# Patient Record
Sex: Female | Born: 1960 | Race: Black or African American | Hispanic: No | Marital: Single
Health system: Southern US, Community
[De-identification: ages and names within clinical notes are randomized; demographics above are authoritative.]

## PROBLEM LIST (undated history)

## (undated) DIAGNOSIS — I2729 Other secondary pulmonary hypertension: Secondary | ICD-10-CM

## (undated) DIAGNOSIS — I482 Chronic atrial fibrillation, unspecified: Secondary | ICD-10-CM

## (undated) DIAGNOSIS — J9621 Acute and chronic respiratory failure with hypoxia: Secondary | ICD-10-CM

## (undated) DIAGNOSIS — J14 Pneumonia due to Hemophilus influenzae: Secondary | ICD-10-CM

## (undated) DIAGNOSIS — I5032 Chronic diastolic (congestive) heart failure: Secondary | ICD-10-CM

---

## 2018-10-21 ENCOUNTER — Inpatient Hospital Stay
Admission: RE | Admit: 2018-10-21 | Discharge: 2018-11-08 | Disposition: A | Discharge status: Rehabilitation Facility | Payer: Medicare HMO | Source: Other Acute Inpatient Hospital | Attending: Internal Medicine | Admitting: Internal Medicine

## 2018-10-21 ENCOUNTER — Encounter: Payer: Self-pay | Admitting: Internal Medicine

## 2018-10-21 ENCOUNTER — Other Ambulatory Visit (HOSPITAL_COMMUNITY): Payer: Self-pay

## 2018-10-21 DIAGNOSIS — I5032 Chronic diastolic (congestive) heart failure: Secondary | ICD-10-CM

## 2018-10-21 DIAGNOSIS — J9621 Acute and chronic respiratory failure with hypoxia: Secondary | ICD-10-CM

## 2018-10-21 DIAGNOSIS — J14 Pneumonia due to Hemophilus influenzae: Secondary | ICD-10-CM

## 2018-10-21 DIAGNOSIS — Z9911 Dependence on respirator [ventilator] status: Secondary | ICD-10-CM

## 2018-10-21 DIAGNOSIS — I2729 Other secondary pulmonary hypertension: Secondary | ICD-10-CM

## 2018-10-21 DIAGNOSIS — J449 Chronic obstructive pulmonary disease, unspecified: Secondary | ICD-10-CM

## 2018-10-21 DIAGNOSIS — Z4659 Encounter for fitting and adjustment of other gastrointestinal appliance and device: Secondary | ICD-10-CM

## 2018-10-21 DIAGNOSIS — I482 Chronic atrial fibrillation, unspecified: Secondary | ICD-10-CM | POA: Diagnosis present

## 2018-10-21 DIAGNOSIS — R52 Pain, unspecified: Secondary | ICD-10-CM

## 2018-10-21 HISTORY — DX: Other secondary pulmonary hypertension: I27.29

## 2018-10-21 HISTORY — DX: Acute and chronic respiratory failure with hypoxia: J96.21

## 2018-10-21 HISTORY — DX: Chronic diastolic (congestive) heart failure: I50.32

## 2018-10-21 HISTORY — DX: Chronic atrial fibrillation, unspecified: I48.20

## 2018-10-21 HISTORY — DX: Pneumonia due to hemophilus influenzae: J14

## 2018-10-21 MED ORDER — CYCLOSPORINE 0.05 % OP EMUL
1.00 | OPHTHALMIC | Status: DC
Start: 2018-10-21 — End: 2018-10-21

## 2018-10-21 MED ORDER — SODIUM CHLORIDE 0.9 % IV SOLN
10.00 | INTRAVENOUS | Status: DC
Start: ? — End: 2018-10-21

## 2018-10-21 MED ORDER — STRI-DEX MAXIMUM STRENGTH 2 % EX PADS
50.00 | MEDICATED_PAD | CUTANEOUS | Status: DC
Start: ? — End: 2018-10-21

## 2018-10-21 MED ORDER — AMLODIPINE BESYLATE 10 MG PO TABS
10.00 | ORAL_TABLET | ORAL | Status: DC
Start: 2018-10-22 — End: 2018-10-21

## 2018-10-21 MED ORDER — POLYETHYLENE GLYCOL 3350 17 G PO PACK
17.00 | PACK | ORAL | Status: DC
Start: ? — End: 2018-10-21

## 2018-10-21 MED ORDER — INSULIN GLARGINE 100 UNIT/ML ~~LOC~~ SOLN
1.00 | SUBCUTANEOUS | Status: DC
Start: ? — End: 2018-10-21

## 2018-10-21 MED ORDER — HYDRALAZINE HCL 20 MG/ML IJ SOLN
20.00 | INTRAMUSCULAR | Status: DC
Start: ? — End: 2018-10-21

## 2018-10-21 MED ORDER — LORAZEPAM 0.5 MG PO TABS
0.50 | ORAL_TABLET | ORAL | Status: DC
Start: ? — End: 2018-10-21

## 2018-10-21 MED ORDER — METOPROLOL TARTRATE 50 MG PO TABS
50.00 | ORAL_TABLET | ORAL | Status: DC
Start: 2018-10-21 — End: 2018-10-21

## 2018-10-21 MED ORDER — INSULIN GLARGINE 100 UNIT/ML ~~LOC~~ SOLN
1.00 | SUBCUTANEOUS | Status: DC
Start: 2018-10-21 — End: 2018-10-21

## 2018-10-21 MED ORDER — GENERIC EXTERNAL MEDICATION
Status: DC
Start: ? — End: 2018-10-21

## 2018-10-21 MED ORDER — INSULIN LISPRO 100 UNIT/ML ~~LOC~~ SOLN
1.00 | SUBCUTANEOUS | Status: DC
Start: 2018-10-21 — End: 2018-10-21

## 2018-10-21 MED ORDER — GABAPENTIN 250 MG/5ML PO SOLN
600.00 | ORAL | Status: DC
Start: 2018-10-21 — End: 2018-10-21

## 2018-10-21 MED ORDER — APAP PO
40.00 | ORAL | Status: DC
Start: 2018-10-21 — End: 2018-10-21

## 2018-10-21 MED ORDER — INSULIN LISPRO 100 UNIT/ML ~~LOC~~ SOLN
1.00 | SUBCUTANEOUS | Status: DC
Start: ? — End: 2018-10-21

## 2018-10-21 MED ORDER — MYLANTA ULTRA 700-300 MG PO CHEW
3.00 | CHEWABLE_TABLET | ORAL | Status: DC
Start: 2018-10-21 — End: 2018-10-21

## 2018-10-21 MED ORDER — ALBUTEROL SULFATE (2.5 MG/3ML) 0.083% IN NEBU
2.50 | INHALATION_SOLUTION | RESPIRATORY_TRACT | Status: DC
Start: ? — End: 2018-10-21

## 2018-10-21 MED ORDER — DIPHENOXYLATE-ATROPINE 2.5-0.025 MG/5ML PO LIQD
5.00 | ORAL | Status: DC
Start: ? — End: 2018-10-21

## 2018-10-21 MED ORDER — FIRST-LANSOPRAZOLE 3 MG/ML PO SUSP
30.00 | ORAL | Status: DC
Start: 2018-10-21 — End: 2018-10-21

## 2018-10-21 MED ORDER — LACTOBACILLUS ACID-PECTIN PO CAPS
0.50 | ORAL_CAPSULE | ORAL | Status: DC
Start: ? — End: 2018-10-21

## 2018-10-21 MED ORDER — APIXABAN 5 MG PO TABS
5.00 | ORAL_TABLET | ORAL | Status: DC
Start: 2018-10-21 — End: 2018-10-21

## 2018-10-21 MED ORDER — METOPROLOL TARTRATE 5 MG/5ML IV SOLN
5.00 | INTRAVENOUS | Status: DC
Start: ? — End: 2018-10-21

## 2018-10-21 MED ORDER — ACETAMINOPHEN 325 MG PO TABS
650.00 | ORAL_TABLET | ORAL | Status: DC
Start: ? — End: 2018-10-21

## 2018-10-21 MED ORDER — LABETALOL HCL 5 MG/ML IV SOLN
20.00 | INTRAVENOUS | Status: DC
Start: ? — End: 2018-10-21

## 2018-10-21 MED ORDER — TROPICAL LIQUID NUTRITION PO LIQD
5.00 | ORAL | Status: DC
Start: 2018-10-22 — End: 2018-10-21

## 2018-10-21 MED ORDER — IBUPROFEN 200 MG PO TABS
400.00 | ORAL_TABLET | ORAL | Status: DC
Start: ? — End: 2018-10-21

## 2018-10-21 MED ORDER — EQL HEAVY DUTY FABRIC STRIPS MISC
25.00 | Status: DC
Start: ? — End: 2018-10-21

## 2018-10-21 NOTE — Consult Note (Signed)
Pulmonary Critical Care Medicine [facility] [place]  PULMONARY SERVICE  Date of Service: 10/21/2018  PULMONARY CRITICAL CARE CONSULT   EDNA REDE  ZOX:[id number]  DOB: 03-25-1961   DOA: 10/21/2018  Referring Physician: Carron Curie, MD  HPI: CALLEE ROHRIG is a 58 y.o. female seen for follow up of Acute on Chronic Respiratory Failure.  Patient presented as a transfer from [facility] was admitted on 18 March with complaints of Haemophilus influenza pneumonia respiratory failure metapneumovirus pneumonia pulmonary hypertension sleep apnea exacerbation of asthma.  Patient apparently presented to the emergency with increased altered mental status and shortness of breath.  The patient had apparently been followed by the primary care office and had worsened in the meantime.  Initially patient was started on BiPAP and did seem to show improvement initially.  Patient was also started on aggressive nebulizers and steroids.  However the situation changed had deterioration in status and ended up pending in the ICU intubated on the ventilator.  Patient was started on antibiotics echocardiogram was also done which did show elevated right-sided pressures.  Subsequently was started on weaning protocol and had been on the ventilator for 11 days and felt that patient be attempted extubation.  Patient did extubate initially did okay however then developed stridor and increased work of breathing and ended up having to be placed back on the ventilator.  Ultimately patient ended up having to have a tracheostomy done.  Transferred to our facility for further management  Review of Systems:  ROS performed and is unremarkable other than noted above.  Past Medical History:  Diagnosis Date  ?Marland Kitchen Anxiety  ?Marland Kitchen Cataract  left eye  ?Marland Kitchen Chest pain  [facility] OF 09/01/2015. WORSE WITH MOVEMENT. NO CHEST TIGHTNESS. PT. FEELS ASSOCIATED WITH STRESS  ?. Coronary artery disease  FOLLOWED BY DR. ST.  CLAIR @ [facility]. HAS ALSO SEEN DR. Emelda Brothers. LAST SEEN 05/2015  ?Marland Kitchen Depression  ?. Diabetes mellitus (*)  AVG: 120-200. RECENTLY HAS BEEN BETTER CONTROLLED  ?Marland Kitchen Fibroid  12-14 WK SIZE  ?Marland Kitchen GERD (gastroesophageal reflux disease)  ?Marland Kitchen Heart murmur  at birth  ?. History of colonic polyps 03/15/2018  9/19 colon - small adenoma Recommend surveillance exam in 9/24 - [facility]  ?Marland Kitchen Iron deficiency anemia  ?. mild LVH (left ventricular hypertrophy) due to hypertensive disease Oct 2012 07/20/2011  ?Marland Kitchen Myocardial infarction (*) 2007  Stent (1) inserted  ?Marland Kitchen Neuropathy  ?. Palpitations 06/10/2015  ?Marland Kitchen S/P cardiac catheterization Oct 2012- patent stents to RCA only 10-20% prox RCA 07/20/2011  ?Marland Kitchen S/P primary angioplasty with coronary stent x 2 to RCA, 2007 and Nov 2011 07/20/2011  ?Marland Kitchen Sleep apnea  On CPAP   Past Surgical History:  Procedure Laterality Date  ?Marland Kitchen Breast biopsy Left 07/24/11  BENIGN  ?. Carotid stent 2007  2 stents in RCA  ?Marland Kitchen Cataract and laser Left 2010  ?Marland Kitchen Cesarean section 2X  ?Marland Kitchen Cholecystectomy  ?Marland Kitchen Colonoscopy 01/16/2014  Dr. Tiburcio Pea- [facility]  ?Marland Kitchen Colonoscopy 03/13/2018  ?Marland Kitchen Combined hysteroscopy diagnostic / d&c 02/20/14  ?Marland Kitchen Laparoscopic cholecystectomy 07/2011  ?Marland Kitchen Shoulder surgery Left  ?. Skin graft 4th finger r hand age 27  ?Marland Kitchen Tubal ligation 01/1985  Concurrent with C/S  ?Marland Kitchen Upper gastrointestinal endoscopy 01/16/2014  Dr. Tiburcio Pea- [facility]  ?Marland Kitchen Vascular ultrasound   Allergies  Allergen Reactions  ?Marland Kitchen Benadryl [Allergy Relief] Anaphylaxis  ?Marland Kitchen Diphenhydramine Hcl Other  ?. Robaxin [Methocarbamol] Other  Felt like having a heart attack  ?Marland Kitchen Sulfa Antibiotics  Rash  ?. Atorvastatin Abdominal Pain  ?Marland Kitchen Lisinopril Cough  ?. Simvastatin Abdominal Pain    Social History   Socioeconomic History  ?. Marital status: Single  Spouse name: Not on file  ?. Number of children: 2  ?. Years of education: 34  ?Marland Kitchen Highest education level: Not on file  Occupational History  ?Marland Kitchen Occupation: Ecologist  ?Marland Kitchen Occupation: Academic librarian: [employer]  Employer: [employer]  Social Needs  ?Marland Kitchen Financial resource strain: Not on file  ?Marland Kitchen Food insecurity  Worry: Not on file  Inability: Not on file  ?Marland Kitchen Transportation needs  Medical: Not on file  Non-medical: Not on file  Tobacco Use  ?Marland Kitchen Smoking status: Former Smoker  Packs/day: 1.00  Types: Cigarettes  Start date: 1992  Last attempt to quit: 2004  Years since quitting: 16.3  ?Marland Kitchen Smokeless tobacco: Never Used  Substance and Sexual Activity  ?Marland Kitchen Alcohol use: Yes  Alcohol/week: 0.0 standard drinks    Family History: Non-Contributory to the present illness  Allergies not on file  Medications: Reviewed on Rounds  Physical Exam:  Vitals: Temperature 98 pulse 90 respiratory rate was 20 saturations 98%  Ventilator Settings currently off the ventilator on T collar  . General: Comfortable at this time . Eyes: Grossly normal lids, irises & conjunctiva . ENT: grossly tongue is normal . Neck: no obvious mass . Cardiovascular: S1-S2 normal no gallop or rub is noted . Respiratory: No rhonchi no rales are noted . Abdomen: Soft and nontender . Skin: no rash seen on limited exam . Musculoskeletal: not rigid . Psychiatric:unable to assess . Neurologic: no seizure no involuntary movements         Labs on Admission:  Basic Metabolic Panel: No results for input(s): NA, K, CL, CO2, GLUCOSE, BUN, CREATININE, CALCIUM, MG, PHOS in the last 168 hours.  No results for input(s): PHART, PCO2ART, PO2ART, HCO3, O2SAT in the last 168 hours.  Liver Function Tests: No results for input(s): AST, ALT, ALKPHOS, BILITOT, PROT, ALBUMIN in the last 168 hours. No results for input(s): LIPASE, AMYLASE in the last 168 hours. No results for input(s): AMMONIA in the last 168 hours.  CBC: No results for input(s): WBC, NEUTROABS, HGB, HCT, MCV, PLT in the last 168 hours.  Cardiac Enzymes: No results for input(s): CKTOTAL, CKMB,  CKMBINDEX, TROPONINI in the last 168 hours.  BNP (last 3 results) No results for input(s): BNP in the last 8760 hours.  ProBNP (last 3 results) No results for input(s): PROBNP in the last 8760 hours.   Radiological Exams on Admission: Dg Abd 1 View  Result Date: 10/21/2018 CLINICAL DATA:  Evaluate NG tube EXAM: ABDOMEN - 1 VIEW COMPARISON:  None. FINDINGS: The distal tip of the NG tube is in the right upper abdomen, near the distal gastric antrum. IMPRESSION: The distal tip of the NG tube is in the right upper quadrant, near the distal gastric antrum. Electronically Signed   By: Gerome Sam III M.D   On: 10/21/2018 13:20   Dg Chest Port 1 View  Result Date: 10/21/2018 CLINICAL DATA:  Patient on ventilator EXAM: PORTABLE CHEST 1 VIEW COMPARISON:  None. FINDINGS: The right PICC line terminates in the central SVC. A tracheostomy tube is projected over the trachea. No pneumothorax. Cardiomegaly identified. Mild atelectasis in the left base. No nodules, masses, suspicious infiltrates, or other abnormalities. IMPRESSION: 1. Cardiomegaly. 2. Support apparatus as above. 3. Mild atelectasis in the left base. Electronically Signed  By: Gerome Samavid  Williams III M.D   On: 10/21/2018 13:19    Assessment/Plan Active Problems:   Acute on chronic respiratory failure with hypoxia (HCC)   Haemophilus influenzae pneumonia (HCC)   Chronic atrial fibrillation   Other secondary pulmonary hypertension (HCC)   Chronic diastolic heart failure (HCC)   1. Acute on chronic respiratory failure with hypoxia patient is off the ventilator right now on T collar tracheostomy is in place.  We will titrate oxygen down and have respiratory therapy assess the tracheostomy for potential of PMV and capping. 2. Pneumonia secondary to Haemophilus influenzae has been treated with antibiotics we will continue with supportive care with follow-up x-ray as above showing some mild atelectasis. 3. Atrial fibrillation rate controlled  at this time we will continue with present management 4. Pulmonary hypertension found on the echocardiogram when the patient was acutely ill we will need to reassess possibly as an outpatient. 5. Chronic diastolic heart failure ejection fraction preserved we will continue with supportive care at this time.  I have personally seen and evaluated the patient, evaluated laboratory and imaging results, formulated the assessment and plan and placed orders. The Patient requires high complexity decision making for assessment and support.  Case was discussed on Rounds with the Respiratory Therapy Staff Time Spent 70minutes  Yevonne PaxSaadat A Khan, MD [facility] Pulmonary Critical Care Medicine Sleep Medicine

## 2018-10-22 DIAGNOSIS — J9621 Acute and chronic respiratory failure with hypoxia: Secondary | ICD-10-CM | POA: Diagnosis not present

## 2018-10-22 DIAGNOSIS — I5032 Chronic diastolic (congestive) heart failure: Secondary | ICD-10-CM | POA: Diagnosis not present

## 2018-10-22 DIAGNOSIS — J14 Pneumonia due to Hemophilus influenzae: Secondary | ICD-10-CM | POA: Diagnosis not present

## 2018-10-22 DIAGNOSIS — I482 Chronic atrial fibrillation, unspecified: Secondary | ICD-10-CM | POA: Diagnosis not present

## 2018-10-22 LAB — URINALYSIS, ROUTINE W REFLEX MICROSCOPIC
Bilirubin Urine: NEGATIVE
Glucose, UA: NEGATIVE mg/dL
Hgb urine dipstick: NEGATIVE
Ketones, ur: NEGATIVE mg/dL
Nitrite: NEGATIVE
Protein, ur: 100 mg/dL — AB
Specific Gravity, Urine: 1.019 (ref 1.005–1.030)
pH: 5 (ref 5.0–8.0)

## 2018-10-22 LAB — CBC WITH DIFFERENTIAL/PLATELET
Abs Immature Granulocytes: 0.05 10*3/uL (ref 0.00–0.07)
Basophils Absolute: 0 10*3/uL (ref 0.0–0.1)
Basophils Relative: 0 %
Eosinophils Absolute: 0 10*3/uL (ref 0.0–0.5)
Eosinophils Relative: 0 %
HCT: 31.7 % — ABNORMAL LOW (ref 36.0–46.0)
Hemoglobin: 9.1 g/dL — ABNORMAL LOW (ref 12.0–15.0)
Immature Granulocytes: 1 %
Lymphocytes Relative: 11 %
Lymphs Abs: 0.7 10*3/uL (ref 0.7–4.0)
MCH: 22.6 pg — ABNORMAL LOW (ref 26.0–34.0)
MCHC: 28.7 g/dL — ABNORMAL LOW (ref 30.0–36.0)
MCV: 78.9 fL — ABNORMAL LOW (ref 80.0–100.0)
Monocytes Absolute: 0.2 10*3/uL (ref 0.1–1.0)
Monocytes Relative: 3 %
Neutro Abs: 5.2 10*3/uL (ref 1.7–7.7)
Neutrophils Relative %: 85 %
Platelets: 230 10*3/uL (ref 150–400)
RBC: 4.02 MIL/uL (ref 3.87–5.11)
RDW: 21.8 % — ABNORMAL HIGH (ref 11.5–15.5)
WBC: 6.1 10*3/uL (ref 4.0–10.5)
nRBC: 0 % (ref 0.0–0.2)

## 2018-10-22 LAB — TSH: TSH: 0.471 u[IU]/mL (ref 0.350–4.500)

## 2018-10-22 LAB — COMPREHENSIVE METABOLIC PANEL
ALT: 27 U/L (ref 0–44)
AST: 34 U/L (ref 15–41)
Albumin: 2 g/dL — ABNORMAL LOW (ref 3.5–5.0)
Alkaline Phosphatase: 84 U/L (ref 38–126)
Anion gap: 10 (ref 5–15)
BUN: 27 mg/dL — ABNORMAL HIGH (ref 6–20)
CO2: 34 mmol/L — ABNORMAL HIGH (ref 22–32)
Calcium: 8.9 mg/dL (ref 8.9–10.3)
Chloride: 97 mmol/L — ABNORMAL LOW (ref 98–111)
Creatinine, Ser: 0.93 mg/dL (ref 0.44–1.00)
GFR calc Af Amer: >60 mL/min (ref >60)
GFR calc non Af Amer: >60 mL/min (ref >60)
Glucose, Bld: 169 mg/dL — ABNORMAL HIGH (ref 70–99)
Potassium: 5.3 mmol/L — ABNORMAL HIGH (ref 3.5–5.1)
Sodium: 141 mmol/L (ref 135–145)
Total Bilirubin: 0.5 mg/dL (ref 0.3–1.2)
Total Protein: 6.2 g/dL — ABNORMAL LOW (ref 6.5–8.1)

## 2018-10-22 LAB — PROTIME-INR
INR: 1.3 — ABNORMAL HIGH (ref 0.8–1.2)
Prothrombin Time: 16.3 seconds — ABNORMAL HIGH (ref 11.4–15.2)

## 2018-10-22 LAB — T4, FREE: Free T4: 0.86 ng/dL (ref 0.82–1.77)

## 2018-10-22 LAB — HEMOGLOBIN A1C
Hgb A1c MFr Bld: 9.5 % — ABNORMAL HIGH (ref 4.8–5.6)
Mean Plasma Glucose: 225.95 mg/dL

## 2018-10-22 LAB — PHOSPHORUS: Phosphorus: 3.2 mg/dL (ref 2.5–4.6)

## 2018-10-22 LAB — MAGNESIUM: Magnesium: 1.7 mg/dL (ref 1.7–2.4)

## 2018-10-22 NOTE — Progress Notes (Addendum)
Pulmonary Critical Care Medicine [facility] [place]   PULMONARY CRITICAL CARE SERVICE  PROGRESS NOTE  Date of Service: 10/22/2018  Stacey Calderon  ZOX:[id number]  DOB: 10/17/1960   DOA: 10/21/2018  Referring Physician: Carron CurieAli Hijazi, MD  HPI: Stacey Calderon is a 58 y.o. female seen for follow up of Acute on Chronic Respiratory Failure.  Patient remains on aerosol trach collar 35% FiO2 at this time.  Resting comfortably with no distress or fever noted.  Medications: Reviewed on Rounds  Physical Exam:  Vitals: Pulse 74 respirations 14 BP 106/58 O2 sat 98% temp 96.9  Ventilator Settings ATC 28%  . General: Comfortable at this time . Eyes: Grossly normal lids, irises & conjunctiva . ENT: grossly tongue is normal . Neck: no obvious mass . Cardiovascular: S1 S2 normal no gallop . Respiratory: No rales or rhonchi noted . Abdomen: soft . Skin: no rash seen on limited exam . Musculoskeletal: not rigid . Psychiatric:unable to assess . Neurologic: no seizure no involuntary movements         Lab Data:   Basic Metabolic Panel: Recent Labs  Lab 10/22/18 0559  NA 141  K 5.3*  CL 97*  CO2 34*  GLUCOSE 169*  BUN 27*  CREATININE 0.93  CALCIUM 8.9  MG 1.7  PHOS 3.2    ABG: No results for input(s): PHART, PCO2ART, PO2ART, HCO3, O2SAT in the last 168 hours.  Liver Function Tests: Recent Labs  Lab 10/22/18 0559  AST 34  ALT 27  ALKPHOS 84  BILITOT 0.5  PROT 6.2*  ALBUMIN 2.0*   No results for input(s): LIPASE, AMYLASE in the last 168 hours. No results for input(s): AMMONIA in the last 168 hours.  CBC: Recent Labs  Lab 10/22/18 0559  WBC 6.1  NEUTROABS 5.2  HGB 9.1*  HCT 31.7*  MCV 78.9*  PLT 230    Cardiac Enzymes: No results for input(s): CKTOTAL, CKMB, CKMBINDEX, TROPONINI in the last 168 hours.  BNP (last 3 results) No results for input(s): BNP in the last 8760 hours.  ProBNP (last 3 results) No results for input(s): PROBNP in the  last 8760 hours.  Radiological Exams: Dg Abd 1 View  Result Date: 10/21/2018 CLINICAL DATA:  Evaluate NG tube EXAM: ABDOMEN - 1 VIEW COMPARISON:  None. FINDINGS: The distal tip of the NG tube is in the right upper abdomen, near the distal gastric antrum. IMPRESSION: The distal tip of the NG tube is in the right upper quadrant, near the distal gastric antrum. Electronically Signed   By: Gerome Samavid  Williams III M.D   On: 10/21/2018 13:20   Dg Chest Port 1 View  Result Date: 10/21/2018 CLINICAL DATA:  Patient on ventilator EXAM: PORTABLE CHEST 1 VIEW COMPARISON:  None. FINDINGS: The right PICC line terminates in the central SVC. A tracheostomy tube is projected over the trachea. No pneumothorax. Cardiomegaly identified. Mild atelectasis in the left base. No nodules, masses, suspicious infiltrates, or other abnormalities. IMPRESSION: 1. Cardiomegaly. 2. Support apparatus as above. 3. Mild atelectasis in the left base. Electronically Signed   By: Gerome Samavid  Williams III M.D   On: 10/21/2018 13:19    Assessment/Plan Active Problems:   Acute on chronic respiratory failure with hypoxia (HCC)   Haemophilus influenzae pneumonia (HCC)   Chronic atrial fibrillation   Other secondary pulmonary hypertension (HCC)   Chronic diastolic heart failure (HCC)   1. Acute on chronic respiratory failure with hypoxia patient remains on ventilator on trach collar 28% FiO2.  Continue  to titrate oxygen as tolerated.  Will progress with PMV and capping as tolerated.  Continue pulmonary toilet secretion management 2. Pneumonia secondary to Haemophilus influenza treated with antibiotics continue supportive care 3. Atrial fibrillation rate controlled continue present management 4. Pulmonary hypertension found on echocardiogram, will reassess at a later date 5. Chronic diastolic heart failure ejection fraction preserved continue supportive care at this time   I have personally seen and evaluated the patient, evaluated laboratory  and imaging results, formulated the assessment and plan and placed orders. The Patient requires high complexity decision making for assessment and support.  Case was discussed on Rounds with the Respiratory Therapy Staff  Yevonne Pax, MD [facility] Pulmonary Critical Care Medicine Sleep Medicine

## 2018-10-23 DIAGNOSIS — I5032 Chronic diastolic (congestive) heart failure: Secondary | ICD-10-CM | POA: Diagnosis not present

## 2018-10-23 DIAGNOSIS — J14 Pneumonia due to Hemophilus influenzae: Secondary | ICD-10-CM | POA: Diagnosis not present

## 2018-10-23 DIAGNOSIS — J9621 Acute and chronic respiratory failure with hypoxia: Secondary | ICD-10-CM | POA: Diagnosis not present

## 2018-10-23 DIAGNOSIS — I482 Chronic atrial fibrillation, unspecified: Secondary | ICD-10-CM | POA: Diagnosis not present

## 2018-10-23 LAB — CBC
HCT: 28.7 % — ABNORMAL LOW (ref 36.0–46.0)
Hemoglobin: 8.1 g/dL — ABNORMAL LOW (ref 12.0–15.0)
MCH: 22.3 pg — ABNORMAL LOW (ref 26.0–34.0)
MCHC: 28.2 g/dL — ABNORMAL LOW (ref 30.0–36.0)
MCV: 78.8 fL — ABNORMAL LOW (ref 80.0–100.0)
Platelets: 384 10*3/uL (ref 150–400)
RBC: 3.64 MIL/uL — ABNORMAL LOW (ref 3.87–5.11)
RDW: 21.3 % — ABNORMAL HIGH (ref 11.5–15.5)
WBC: 6.3 10*3/uL (ref 4.0–10.5)
nRBC: 0 % (ref 0.0–0.2)

## 2018-10-23 LAB — URINE CULTURE: Culture: NO GROWTH

## 2018-10-23 LAB — BASIC METABOLIC PANEL
Anion gap: 8 (ref 5–15)
BUN: 31 mg/dL — ABNORMAL HIGH (ref 6–20)
CO2: 38 mmol/L — ABNORMAL HIGH (ref 22–32)
Calcium: 9 mg/dL (ref 8.9–10.3)
Chloride: 94 mmol/L — ABNORMAL LOW (ref 98–111)
Creatinine, Ser: 0.9 mg/dL (ref 0.44–1.00)
GFR calc Af Amer: >60 mL/min (ref >60)
GFR calc non Af Amer: >60 mL/min (ref >60)
Glucose, Bld: 234 mg/dL — ABNORMAL HIGH (ref 70–99)
Potassium: 5.1 mmol/L (ref 3.5–5.1)
Sodium: 140 mmol/L (ref 135–145)

## 2018-10-23 LAB — PHOSPHORUS: Phosphorus: 2.7 mg/dL (ref 2.5–4.6)

## 2018-10-23 LAB — MAGNESIUM: Magnesium: 2.2 mg/dL (ref 1.7–2.4)

## 2018-10-23 NOTE — Progress Notes (Signed)
Pulmonary Critical Care Medicine [facility] [place]   PULMONARY CRITICAL CARE SERVICE  PROGRESS NOTE  Date of Service: 10/23/2018  Stacey Calderon  MGN:[id number]  DOB: September 27, 1960   DOA: 10/21/2018  Referring Physician: Carron Curie, MD  HPI: Stacey Calderon is a 58 y.o. female seen for follow up of Acute on Chronic Respiratory Failure.  Patient is on T collar right now on 28% FiO2 was attempted at [facility] did not tolerate should have the tracheostomy downsized  Medications: Reviewed on Rounds  Physical Exam:  Vitals: Temperature 97.6 pulse 67 respiratory 20 blood pressure 125/87 saturations 100%  Ventilator Settings currently on T collar has been on 28% FiO2  . General: Comfortable at this time . Eyes: Grossly normal lids, irises & conjunctiva . ENT: grossly tongue is normal . Neck: no obvious mass . Cardiovascular: S1 S2 normal no gallop . Respiratory: Scattered rhonchi expansion is equal . Abdomen: soft . Skin: no rash seen on limited exam . Musculoskeletal: not rigid . Psychiatric:unable to assess . Neurologic: no seizure no involuntary movements         Lab Data:   Basic Metabolic Panel: Recent Labs  Lab 10/22/18 0559 10/23/18 0500  NA 141 140  K 5.3* 5.1  CL 97* 94*  CO2 34* 38*  GLUCOSE 169* 234*  BUN 27* 31*  CREATININE 0.93 0.90  CALCIUM 8.9 9.0  MG 1.7 2.2  PHOS 3.2 2.7    ABG: No results for input(s): PHART, PCO2ART, PO2ART, HCO3, O2SAT in the last 168 hours.  Liver Function Tests: Recent Labs  Lab 10/22/18 0559  AST 34  ALT 27  ALKPHOS 84  BILITOT 0.5  PROT 6.2*  ALBUMIN 2.0*   No results for input(s): LIPASE, AMYLASE in the last 168 hours. No results for input(s): AMMONIA in the last 168 hours.  CBC: Recent Labs  Lab 10/22/18 0559 10/23/18 0500  WBC 6.1 6.3  NEUTROABS 5.2  --   HGB 9.1* 8.1*  HCT 31.7* 28.7*  MCV 78.9* 78.8*  PLT 230 384    Cardiac Enzymes: No results for input(s): CKTOTAL, CKMB, CKMBINDEX,  TROPONINI in the last 168 hours.  BNP (last 3 results) No results for input(s): BNP in the last 8760 hours.  ProBNP (last 3 results) No results for input(s): PROBNP in the last 8760 hours.  Radiological Exams: No results found.  Assessment/Plan Active Problems:   Acute on chronic respiratory failure with hypoxia (HCC)   Haemophilus influenzae pneumonia (HCC)   Chronic atrial fibrillation   Other secondary pulmonary hypertension (HCC)   Chronic diastolic heart failure (HCC)   1. Acute on chronic respiratory failure hypoxia we will continue with T collar trials titrate oxygen continue pulmonary toilet 2. Haemophilus influenza pneumonia treated we will monitor radiologically 3. Chronic atrial fibrillation rate controlled 4. Secondary pulmonary hypertension continue with oxygen therapy as necessary 5. Chronic diastolic heart failure diuretics as necessary monitor fluid status   I have personally seen and evaluated the patient, evaluated laboratory and imaging results, formulated the assessment and plan and placed orders. The Patient requires high complexity decision making for assessment and support.  Case was discussed on Rounds with the Respiratory Therapy Staff  Yevonne Pax, MD [facility] Pulmonary Critical Care Medicine Sleep Medicine

## 2018-10-24 DIAGNOSIS — J14 Pneumonia due to Hemophilus influenzae: Secondary | ICD-10-CM | POA: Diagnosis not present

## 2018-10-24 DIAGNOSIS — J9621 Acute and chronic respiratory failure with hypoxia: Secondary | ICD-10-CM | POA: Diagnosis not present

## 2018-10-24 DIAGNOSIS — I482 Chronic atrial fibrillation, unspecified: Secondary | ICD-10-CM | POA: Diagnosis not present

## 2018-10-24 DIAGNOSIS — I5032 Chronic diastolic (congestive) heart failure: Secondary | ICD-10-CM | POA: Diagnosis not present

## 2018-10-24 LAB — CULTURE, RESPIRATORY W GRAM STAIN: Culture: NO GROWTH

## 2018-10-24 LAB — POTASSIUM: Potassium: 5.1 mmol/L (ref 3.5–5.1)

## 2018-10-24 NOTE — Progress Notes (Addendum)
Pulmonary Critical Care Medicine [facility] [place]   PULMONARY CRITICAL CARE SERVICE  PROGRESS NOTE  Date of Service: 10/24/2018  JANETTE MUSZYNSKI  CNO:[id number]  DOB: [birth year]   DOA: 10/21/2018  Referring Physician: Carron Curie, MD  HPI: CORTNEE CAMPBELL is a 58 y.o. female seen for follow up of Acute on Chronic Respiratory Failure.  Patient currently on aerosol trach collar 28% FiO2.  Using PMV without difficulty.  Good saturations 90 with no distress.  Medications: Reviewed on Rounds  Physical Exam:  Vitals: Pulse 74 respirations 21 BP 129/84 O2 sat 94% temp 99.4  Ventilator Settings ATC 28%  . General: Comfortable at this time . Eyes: Grossly normal lids, irises & conjunctiva . ENT: grossly tongue is normal . Neck: no obvious mass . Cardiovascular: S1 S2 normal no gallop . Respiratory: Coarse breath sounds . Abdomen: soft . Skin: no rash seen on limited exam . Musculoskeletal: not rigid . Psychiatric:unable to assess . Neurologic: no seizure no involuntary movements         Lab Data:   Basic Metabolic Panel: Recent Labs  Lab 10/22/18 0559 10/23/18 0500 10/24/18 0431  NA 141 140  --   K 5.3* 5.1 5.1  CL 97* 94*  --   CO2 34* 38*  --   GLUCOSE 169* 234*  --   BUN 27* 31*  --   CREATININE 0.93 0.90  --   CALCIUM 8.9 9.0  --   MG 1.7 2.2  --   PHOS 3.2 2.7  --     ABG: No results for input(s): PHART, PCO2ART, PO2ART, HCO3, O2SAT in the last 168 hours.  Liver Function Tests: Recent Labs  Lab 10/22/18 0559  AST 34  ALT 27  ALKPHOS 84  BILITOT 0.5  PROT 6.2*  ALBUMIN 2.0*   No results for input(s): LIPASE, AMYLASE in the last 168 hours. No results for input(s): AMMONIA in the last 168 hours.  CBC: Recent Labs  Lab 10/22/18 0559 10/23/18 0500  WBC 6.1 6.3  NEUTROABS 5.2  --   HGB 9.1* 8.1*  HCT 31.7* 28.7*  MCV 78.9* 78.8*  PLT 230 384    Cardiac Enzymes: No results for input(s): CKTOTAL, CKMB, CKMBINDEX, TROPONINI in  the last 168 hours.  BNP (last 3 results) No results for input(s): BNP in the last 8760 hours.  ProBNP (last 3 results) No results for input(s): PROBNP in the last 8760 hours.  Radiological Exams: No results found.  Assessment/Plan Active Problems:   Acute on chronic respiratory failure with hypoxia (HCC)   Haemophilus influenzae pneumonia (HCC)   Chronic atrial fibrillation   Other secondary pulmonary hypertension (HCC)   Chronic diastolic heart failure (HCC)   1. Acute on chronic respiratory failure with hypoxia continue T collar trials and titrate oxygen as tolerated.  Continue supportive measures as well as pulmonary toilet and secretion management 2. Haemophilus influenza pneumonia treated continue to monitor 3. Chronic atrial fibrillation rate controlled 4. Secondary pulmonary hypertension continue with oxygen therapy as necessary 5. Chronic diastolic failure diuretics as necessary continue to monitor fluid status   I have personally seen and evaluated the patient, evaluated laboratory and imaging results, formulated the assessment and plan and placed orders. The Patient requires high complexity decision making for assessment and support.  Case was discussed on Rounds with the Respiratory Therapy Staff  Yevonne Pax, MD [facility] Pulmonary Critical Care Medicine Sleep Medicine

## 2018-10-25 DIAGNOSIS — I5032 Chronic diastolic (congestive) heart failure: Secondary | ICD-10-CM | POA: Diagnosis not present

## 2018-10-25 DIAGNOSIS — J9621 Acute and chronic respiratory failure with hypoxia: Secondary | ICD-10-CM | POA: Diagnosis not present

## 2018-10-25 DIAGNOSIS — J14 Pneumonia due to Hemophilus influenzae: Secondary | ICD-10-CM | POA: Diagnosis not present

## 2018-10-25 DIAGNOSIS — I482 Chronic atrial fibrillation, unspecified: Secondary | ICD-10-CM | POA: Diagnosis not present

## 2018-10-25 LAB — RENAL FUNCTION PANEL
Albumin: 2.2 g/dL — ABNORMAL LOW (ref 3.5–5.0)
Anion gap: 8 (ref 5–15)
BUN: 28 mg/dL — ABNORMAL HIGH (ref 6–20)
CO2: 40 mmol/L — ABNORMAL HIGH (ref 22–32)
Calcium: 9.1 mg/dL (ref 8.9–10.3)
Chloride: 96 mmol/L — ABNORMAL LOW (ref 98–111)
Creatinine, Ser: 0.85 mg/dL (ref 0.44–1.00)
GFR calc Af Amer: >60 mL/min (ref >60)
GFR calc non Af Amer: >60 mL/min (ref >60)
Glucose, Bld: 83 mg/dL (ref 70–99)
Phosphorus: 2.4 mg/dL — ABNORMAL LOW (ref 2.5–4.6)
Potassium: 4.2 mmol/L (ref 3.5–5.1)
Sodium: 144 mmol/L (ref 135–145)

## 2018-10-25 LAB — CBC
HCT: 28.8 % — ABNORMAL LOW (ref 36.0–46.0)
Hemoglobin: 8.1 g/dL — ABNORMAL LOW (ref 12.0–15.0)
MCH: 22.6 pg — ABNORMAL LOW (ref 26.0–34.0)
MCHC: 28.1 g/dL — ABNORMAL LOW (ref 30.0–36.0)
MCV: 80.2 fL (ref 80.0–100.0)
Platelets: 453 10*3/uL — ABNORMAL HIGH (ref 150–400)
RBC: 3.59 MIL/uL — ABNORMAL LOW (ref 3.87–5.11)
RDW: 22.1 % — ABNORMAL HIGH (ref 11.5–15.5)
WBC: 6.2 10*3/uL (ref 4.0–10.5)
nRBC: 0.8 % — ABNORMAL HIGH (ref 0.0–0.2)

## 2018-10-25 LAB — MAGNESIUM: Magnesium: 2.2 mg/dL (ref 1.7–2.4)

## 2018-10-25 NOTE — Progress Notes (Addendum)
Pulmonary Critical Care Medicine [facility] [place]   PULMONARY CRITICAL CARE SERVICE  PROGRESS NOTE  Date of Service: 10/25/2018  Stacey Calderon  [id number]  DOB: 1960-08-04   DOA: 10/21/2018  Referring Physician: Carron Curie, MD  HPI: Stacey Calderon is a 58 y.o. female seen for follow up of Acute on Chronic Respiratory Failure. Patient continues on aerosol trach collar 28% FiO2 using PMV without difficulty.  Currently satting well with no distress.  Medications: Reviewed on Rounds  Physical Exam:  Vitals: Pulse 72 respirations 16 BP 140/83 O2 sat 98% temp 98.7  Ventilator Settings ATC 28%  . General: Comfortable at this time . Eyes: Grossly normal lids, irises & conjunctiva . ENT: grossly tongue is normal . Neck: no obvious mass . Cardiovascular: S1 S2 normal no gallop . Respiratory: Coarse breath sounds . Abdomen: soft . Skin: no rash seen on limited exam . Musculoskeletal: not rigid . Psychiatric:unable to assess . Neurologic: no seizure no involuntary movements         Lab Data:   Basic Metabolic Panel: Recent Labs  Lab 10/22/18 0559 10/23/18 0500 10/24/18 0431 10/25/18 0503  NA 141 140  --  144  K 5.3* 5.1 5.1 4.2  CL 97* 94*  --  96*  CO2 34* 38*  --  40*  GLUCOSE 169* 234*  --  83  BUN 27* 31*  --  28*  CREATININE 0.93 0.90  --  0.85  CALCIUM 8.9 9.0  --  9.1  MG 1.7 2.2  --  2.2  PHOS 3.2 2.7  --  2.4*    ABG: No results for input(s): PHART, PCO2ART, PO2ART, HCO3, O2SAT in the last 168 hours.  Liver Function Tests: Recent Labs  Lab 10/22/18 0559 10/25/18 0503  AST 34  --   ALT 27  --   ALKPHOS 84  --   BILITOT 0.5  --   PROT 6.2*  --   ALBUMIN 2.0* 2.2*   No results for input(s): LIPASE, AMYLASE in the last 168 hours. No results for input(s): AMMONIA in the last 168 hours.  CBC: Recent Labs  Lab 10/22/18 0559 10/23/18 0500 10/25/18 0503  WBC 6.1 6.3 6.2  NEUTROABS 5.2  --   --   HGB 9.1* 8.1* 8.1*  HCT  31.7* 28.7* 28.8*  MCV 78.9* 78.8* 80.2  PLT 230 384 453*    Cardiac Enzymes: No results for input(s): CKTOTAL, CKMB, CKMBINDEX, TROPONINI in the last 168 hours.  BNP (last 3 results) No results for input(s): BNP in the last 8760 hours.  ProBNP (last 3 results) No results for input(s): PROBNP in the last 8760 hours.  Radiological Exams: No results found.  Assessment/Plan Active Problems:   Acute on chronic respiratory failure with hypoxia (HCC)   Haemophilus influenzae pneumonia (HCC)   Chronic atrial fibrillation   Other secondary pulmonary hypertension (HCC)   Chronic diastolic heart failure (HCC)   1. Acute on chronic respiratory failure hypoxia continue T, titrate oxygen as tolerated.  Continue supportive measures and pulmonary toilet 2. Haemophilus influenza pneumonia treated continue to monitor 3. Chronic atrial fibrillation rate controlled 4. Secondary pulmonary hypertension continue oxygen therapy as necessary 5. Chronic diastolic failure diuretics as necessary continue to monitor fluid status   I have personally seen and evaluated the patient, evaluated laboratory and imaging results, formulated the assessment and plan and placed orders. The Patient requires high complexity decision making for assessment and support.  Case was discussed on Rounds with  the Respiratory Therapy Staff  Allyne Gee, MD [facility] Pulmonary Critical Care Medicine Sleep Medicine

## 2018-10-26 DIAGNOSIS — I5032 Chronic diastolic (congestive) heart failure: Secondary | ICD-10-CM | POA: Diagnosis not present

## 2018-10-26 DIAGNOSIS — J9621 Acute and chronic respiratory failure with hypoxia: Secondary | ICD-10-CM | POA: Diagnosis not present

## 2018-10-26 DIAGNOSIS — I482 Chronic atrial fibrillation, unspecified: Secondary | ICD-10-CM | POA: Diagnosis not present

## 2018-10-26 DIAGNOSIS — J14 Pneumonia due to Hemophilus influenzae: Secondary | ICD-10-CM | POA: Diagnosis not present

## 2018-10-26 NOTE — Progress Notes (Addendum)
Pulmonary Critical Care Medicine [facility] [place]   PULMONARY CRITICAL CARE SERVICE  PROGRESS NOTE  Date of Service: 10/26/2018  Stacey Calderon  EZM:[id number]  DOB: 1960-10-09   DOA: 10/21/2018  Referring Physician: Carron Curie, MD  HPI: Stacey Calderon is a 58 y.o. female seen for follow up of Acute on Chronic Respiratory Failure.  Patient remains on aerosol trach collar 28% FiO2 using PMV without difficulty.  Satting well.  Medications: Reviewed on Rounds  Physical Exam:  Vitals: Pulse 72 respirations 10 BP 132/65 O2 sat 99% temp 97.5  Ventilator Settings ATC 28%  . General: Comfortable at this time . Eyes: Grossly normal lids, irises & conjunctiva . ENT: grossly tongue is normal . Neck: no obvious mass . Cardiovascular: S1 S2 normal no gallop . Respiratory: Coarse breath sounds . Abdomen: soft . Skin: no rash seen on limited exam . Musculoskeletal: not rigid . Psychiatric:unable to assess . Neurologic: no seizure no involuntary movements         Lab Data:   Basic Metabolic Panel: Recent Labs  Lab 10/22/18 0559 10/23/18 0500 10/24/18 0431 10/25/18 0503  NA 141 140  --  144  K 5.3* 5.1 5.1 4.2  CL 97* 94*  --  96*  CO2 34* 38*  --  40*  GLUCOSE 169* 234*  --  83  BUN 27* 31*  --  28*  CREATININE 0.93 0.90  --  0.85  CALCIUM 8.9 9.0  --  9.1  MG 1.7 2.2  --  2.2  PHOS 3.2 2.7  --  2.4*    ABG: No results for input(s): PHART, PCO2ART, PO2ART, HCO3, O2SAT in the last 168 hours.  Liver Function Tests: Recent Labs  Lab 10/22/18 0559 10/25/18 0503  AST 34  --   ALT 27  --   ALKPHOS 84  --   BILITOT 0.5  --   PROT 6.2*  --   ALBUMIN 2.0* 2.2*   No results for input(s): LIPASE, AMYLASE in the last 168 hours. No results for input(s): AMMONIA in the last 168 hours.  CBC: Recent Labs  Lab 10/22/18 0559 10/23/18 0500 10/25/18 0503  WBC 6.1 6.3 6.2  NEUTROABS 5.2  --   --   HGB 9.1* 8.1* 8.1*  HCT 31.7* 28.7* 28.8*  MCV 78.9*  78.8* 80.2  PLT 230 384 453*    Cardiac Enzymes: No results for input(s): CKTOTAL, CKMB, CKMBINDEX, TROPONINI in the last 168 hours.  BNP (last 3 results) No results for input(s): BNP in the last 8760 hours.  ProBNP (last 3 results) No results for input(s): PROBNP in the last 8760 hours.  Radiological Exams: No results found.  Assessment/Plan Active Problems:   Acute on chronic respiratory failure with hypoxia (HCC)   Haemophilus influenzae pneumonia (HCC)   Chronic atrial fibrillation   Other secondary pulmonary hypertension (HCC)   Chronic diastolic heart failure (HCC)   1. Acute on chronic respiratory failure with hypoxia continue to titrate oxygen as tolerated.  Continue supportive measures and pulmonary toilet 2. Haemophilus influenza pneumonia treated continue to monitor 3. Chronic atrial fibrillation rate controlled 4. Secondary pulmonary hypertension continue oxygen therapy as necessary 5. Chronic diastolic failure diuretics as necessary continue to monitor   I have personally seen and evaluated the patient, evaluated laboratory and imaging results, formulated the assessment and plan and placed orders. The Patient requires high complexity decision making for assessment and support.  Case was discussed on Rounds with the Respiratory Therapy Staff  Allyne Gee, MD [facility] Pulmonary Critical Care Medicine Sleep Medicine

## 2018-10-27 DIAGNOSIS — I5032 Chronic diastolic (congestive) heart failure: Secondary | ICD-10-CM | POA: Diagnosis not present

## 2018-10-27 DIAGNOSIS — J9621 Acute and chronic respiratory failure with hypoxia: Secondary | ICD-10-CM | POA: Diagnosis not present

## 2018-10-27 DIAGNOSIS — J14 Pneumonia due to Hemophilus influenzae: Secondary | ICD-10-CM | POA: Diagnosis not present

## 2018-10-27 DIAGNOSIS — I482 Chronic atrial fibrillation, unspecified: Secondary | ICD-10-CM | POA: Diagnosis not present

## 2018-10-27 NOTE — Progress Notes (Addendum)
Pulmonary Critical Care Medicine [facility] [place]   PULMONARY CRITICAL CARE SERVICE  PROGRESS NOTE  Date of Service: 10/27/2018  Stacey Calderon  [id number]  DOB: 08/18/1960   DOA: 10/21/2018  Referring Physician: Carron Curie, MD  HPI: Stacey Calderon is a 58 y.o. female seen for follow up of Acute on Chronic Respiratory Failure.  Patient has been placed on increased oxygen FiO2 35% via ATC.  Currently back on 28% using PMV without difficulty.  Patient had moderate desaturation overnight however satting well at 99% currently.  Medications: Reviewed on Rounds  Physical Exam:  Vitals: Pulse 70 respirations 20 BP 132/74 O2 sat 100% temp 97.9  Ventilator Settings ATC 28%  . General: Comfortable at this time . Eyes: Grossly normal lids, irises & conjunctiva . ENT: grossly tongue is normal . Neck: no obvious mass . Cardiovascular: S1 S2 normal no gallop . Respiratory: Coarse breath sounds . Abdomen: soft . Skin: no rash seen on limited exam . Musculoskeletal: not rigid . Psychiatric:unable to assess . Neurologic: no seizure no involuntary movements         Lab Data:   Basic Metabolic Panel: Recent Labs  Lab 10/22/18 0559 10/23/18 0500 10/24/18 0431 10/25/18 0503  NA 141 140  --  144  K 5.3* 5.1 5.1 4.2  CL 97* 94*  --  96*  CO2 34* 38*  --  40*  GLUCOSE 169* 234*  --  83  BUN 27* 31*  --  28*  CREATININE 0.93 0.90  --  0.85  CALCIUM 8.9 9.0  --  9.1  MG 1.7 2.2  --  2.2  PHOS 3.2 2.7  --  2.4*    ABG: No results for input(s): PHART, PCO2ART, PO2ART, HCO3, O2SAT in the last 168 hours.  Liver Function Tests: Recent Labs  Lab 10/22/18 0559 10/25/18 0503  AST 34  --   ALT 27  --   ALKPHOS 84  --   BILITOT 0.5  --   PROT 6.2*  --   ALBUMIN 2.0* 2.2*   No results for input(s): LIPASE, AMYLASE in the last 168 hours. No results for input(s): AMMONIA in the last 168 hours.  CBC: Recent Labs  Lab 10/22/18 0559 10/23/18 0500  10/25/18 0503  WBC 6.1 6.3 6.2  NEUTROABS 5.2  --   --   HGB 9.1* 8.1* 8.1*  HCT 31.7* 28.7* 28.8*  MCV 78.9* 78.8* 80.2  PLT 230 384 453*    Cardiac Enzymes: No results for input(s): CKTOTAL, CKMB, CKMBINDEX, TROPONINI in the last 168 hours.  BNP (last 3 results) No results for input(s): BNP in the last 8760 hours.  ProBNP (last 3 results) No results for input(s): PROBNP in the last 8760 hours.  Radiological Exams: No results found.  Assessment/Plan Active Problems:   Acute on chronic respiratory failure with hypoxia (HCC)   Haemophilus influenzae pneumonia (HCC)   Chronic atrial fibrillation   Other secondary pulmonary hypertension (HCC)   Chronic diastolic heart failure (HCC)   1. Acute on chronic respiratory failure with hypoxia continue titrate oxygen as tolerated.  Continue supportive measures and pulmonary toilet 2. Amoxicillin for Lenze pneumonia treated continue to monitor 3. Chronic atrial fibrillation rate controlled 4. GERD pulmonary hypertension continue oxygen therapy as necessary 5. Chronic diastolic failure diuretics as necessary continue to monitor   I have personally seen and evaluated the patient, evaluated laboratory and imaging results, formulated the assessment and plan and placed orders. The Patient requires high complexity  decision making for assessment and support.  Case was discussed on Rounds with the Respiratory Therapy Staff  Allyne Gee, MD [facility] Pulmonary Critical Care Medicine Sleep Medicine

## 2018-10-28 DIAGNOSIS — J14 Pneumonia due to Hemophilus influenzae: Secondary | ICD-10-CM | POA: Diagnosis not present

## 2018-10-28 DIAGNOSIS — I5032 Chronic diastolic (congestive) heart failure: Secondary | ICD-10-CM | POA: Diagnosis not present

## 2018-10-28 DIAGNOSIS — I482 Chronic atrial fibrillation, unspecified: Secondary | ICD-10-CM | POA: Diagnosis not present

## 2018-10-28 DIAGNOSIS — J9621 Acute and chronic respiratory failure with hypoxia: Secondary | ICD-10-CM | POA: Diagnosis not present

## 2018-10-28 LAB — BASIC METABOLIC PANEL
Anion gap: 5 (ref 5–15)
BUN: 19 mg/dL (ref 6–20)
CO2: 41 mmol/L — ABNORMAL HIGH (ref 22–32)
Calcium: 8.9 mg/dL (ref 8.9–10.3)
Chloride: 94 mmol/L — ABNORMAL LOW (ref 98–111)
Creatinine, Ser: 0.77 mg/dL (ref 0.44–1.00)
GFR calc Af Amer: >60 mL/min (ref >60)
GFR calc non Af Amer: >60 mL/min (ref >60)
Glucose, Bld: 128 mg/dL — ABNORMAL HIGH (ref 70–99)
Potassium: 4.7 mmol/L (ref 3.5–5.1)
Sodium: 140 mmol/L (ref 135–145)

## 2018-10-28 LAB — CBC
HCT: 31.7 % — ABNORMAL LOW (ref 36.0–46.0)
Hemoglobin: 9.1 g/dL — ABNORMAL LOW (ref 12.0–15.0)
MCH: 23.2 pg — ABNORMAL LOW (ref 26.0–34.0)
MCHC: 28.7 g/dL — ABNORMAL LOW (ref 30.0–36.0)
MCV: 80.9 fL (ref 80.0–100.0)
Platelets: 486 10*3/uL — ABNORMAL HIGH (ref 150–400)
RBC: 3.92 MIL/uL (ref 3.87–5.11)
RDW: 22.7 % — ABNORMAL HIGH (ref 11.5–15.5)
WBC: 5.4 10*3/uL (ref 4.0–10.5)
nRBC: 0.4 % — ABNORMAL HIGH (ref 0.0–0.2)

## 2018-10-28 LAB — MAGNESIUM: Magnesium: 1.8 mg/dL (ref 1.7–2.4)

## 2018-10-28 LAB — PHOSPHORUS: Phosphorus: 2.5 mg/dL (ref 2.5–4.6)

## 2018-10-28 NOTE — Progress Notes (Addendum)
Pulmonary Critical Care Medicine [facility] [place]   PULMONARY CRITICAL CARE SERVICE  PROGRESS NOTE  Date of Service: 10/28/2018  Stacey Calderon  [id number]  DOB: January 19, 1961   DOA: 10/21/2018  Referring Physician: Carron Curie, MD  HPI: Stacey Calderon is a 58 y.o. female seen for follow up of Acute on Chronic Respiratory Failure.  Patient remains on aerosol trach collar 28% FiO2.  Using PMV without difficulty.  Will start capping trials today.  Medications: Reviewed on Rounds  Physical Exam:  Vitals: Pulse 70 respirations 16 BP 121/73 O2 sat 95% temp 98.1  Ventilator Settings ATC 28%  . General: Comfortable at this time . Eyes: Grossly normal lids, irises & conjunctiva . ENT: grossly tongue is normal . Neck: no obvious mass . Cardiovascular: S1 S2 normal no gallop . Respiratory: Coarse breath sounds . Abdomen: soft . Skin: no rash seen on limited exam . Musculoskeletal: not rigid . Psychiatric:unable to assess . Neurologic: no seizure no involuntary movements         Lab Data:   Basic Metabolic Panel: Recent Labs  Lab 10/22/18 0559 10/23/18 0500 10/24/18 0431 10/25/18 0503 10/28/18 0710  NA 141 140  --  144 140  K 5.3* 5.1 5.1 4.2 4.7  CL 97* 94*  --  96* 94*  CO2 34* 38*  --  40* 41*  GLUCOSE 169* 234*  --  83 128*  BUN 27* 31*  --  28* 19  CREATININE 0.93 0.90  --  0.85 0.77  CALCIUM 8.9 9.0  --  9.1 8.9  MG 1.7 2.2  --  2.2 1.8  PHOS 3.2 2.7  --  2.4* 2.5    ABG: No results for input(s): PHART, PCO2ART, PO2ART, HCO3, O2SAT in the last 168 hours.  Liver Function Tests: Recent Labs  Lab 10/22/18 0559 10/25/18 0503  AST 34  --   ALT 27  --   ALKPHOS 84  --   BILITOT 0.5  --   PROT 6.2*  --   ALBUMIN 2.0* 2.2*   No results for input(s): LIPASE, AMYLASE in the last 168 hours. No results for input(s): AMMONIA in the last 168 hours.  CBC: Recent Labs  Lab 10/22/18 0559 10/23/18 0500 10/25/18 0503 10/28/18 0710  WBC 6.1  6.3 6.2 5.4  NEUTROABS 5.2  --   --   --   HGB 9.1* 8.1* 8.1* 9.1*  HCT 31.7* 28.7* 28.8* 31.7*  MCV 78.9* 78.8* 80.2 80.9  PLT 230 384 453* 486*    Cardiac Enzymes: No results for input(s): CKTOTAL, CKMB, CKMBINDEX, TROPONINI in the last 168 hours.  BNP (last 3 results) No results for input(s): BNP in the last 8760 hours.  ProBNP (last 3 results) No results for input(s): PROBNP in the last 8760 hours.  Radiological Exams: No results found.  Assessment/Plan Active Problems:   Acute on chronic respiratory failure with hypoxia (HCC)   Haemophilus influenzae pneumonia (HCC)   Chronic atrial fibrillation   Other secondary pulmonary hypertension (HCC)   Chronic diastolic heart failure (HCC)   1. Acute on chronic respiratory failure with hypoxia continue titrate oxygen.  Start capping trials at this time.  Continue supportive measures and pulmonary toilet 2. Haemophilus influenza pneumonia treated continue to monitor 3. Chronic atrial fibrillation rate controlled 4. Secondary pulmonary hypertension continue oxygen therapy, as needed. 5. Chronic diastolic failure diuretics as necessary continue to monitor   I have personally seen and evaluated the patient, evaluated laboratory and imaging results,  formulated the assessment and plan and placed orders. The Patient requires high complexity decision making for assessment and support.  Case was discussed on Rounds with the Respiratory Therapy Staff  Allyne Gee, MD [facility] Pulmonary Critical Care Medicine Sleep Medicine

## 2018-10-29 DIAGNOSIS — J9621 Acute and chronic respiratory failure with hypoxia: Secondary | ICD-10-CM | POA: Diagnosis not present

## 2018-10-29 DIAGNOSIS — I5032 Chronic diastolic (congestive) heart failure: Secondary | ICD-10-CM | POA: Diagnosis not present

## 2018-10-29 DIAGNOSIS — I482 Chronic atrial fibrillation, unspecified: Secondary | ICD-10-CM | POA: Diagnosis not present

## 2018-10-29 DIAGNOSIS — J14 Pneumonia due to Hemophilus influenzae: Secondary | ICD-10-CM | POA: Diagnosis not present

## 2018-10-29 NOTE — Progress Notes (Addendum)
Pulmonary Critical Care Medicine [facility] [place]   PULMONARY CRITICAL CARE SERVICE  PROGRESS NOTE  Date of Service: 10/29/2018  Stacey Calderon  [id number]  DOB: 1960/12/11   DOA: 10/21/2018  Referring Physician: Carron Curie, MD  HPI: Stacey Calderon is a 58 y.o. female seen for follow up of Acute on Chronic Respiratory Failure.  Patient remains on aerosol trach collar 28% FiO2 using PMV without difficulty.  Respiratory therapy continues report patient desaturates at night on the 28% and had to be increased until morning.  Patient does use CPAP at home likely due to OSA.  Medications: Reviewed on Rounds  Physical Exam:  Vitals: Pulse 72 respirations 20 BP 138/72 O2 sat 96% temp 98.8  Ventilator Settings ATC 28%  . General: Comfortable at this time . Eyes: Grossly normal lids, irises & conjunctiva . ENT: grossly tongue is normal . Neck: no obvious mass . Cardiovascular: S1 S2 normal no gallop . Respiratory: Coarse breath sounds . Abdomen: soft . Skin: no rash seen on limited exam . Musculoskeletal: not rigid . Psychiatric:unable to assess . Neurologic: no seizure no involuntary movements         Lab Data:   Basic Metabolic Panel: Recent Labs  Lab 10/23/18 0500 10/24/18 0431 10/25/18 0503 10/28/18 0710  NA 140  --  144 140  K 5.1 5.1 4.2 4.7  CL 94*  --  96* 94*  CO2 38*  --  40* 41*  GLUCOSE 234*  --  83 128*  BUN 31*  --  28* 19  CREATININE 0.90  --  0.85 0.77  CALCIUM 9.0  --  9.1 8.9  MG 2.2  --  2.2 1.8  PHOS 2.7  --  2.4* 2.5    ABG: No results for input(s): PHART, PCO2ART, PO2ART, HCO3, O2SAT in the last 168 hours.  Liver Function Tests: Recent Labs  Lab 10/25/18 0503  ALBUMIN 2.2*   No results for input(s): LIPASE, AMYLASE in the last 168 hours. No results for input(s): AMMONIA in the last 168 hours.  CBC: Recent Labs  Lab 10/23/18 0500 10/25/18 0503 10/28/18 0710  WBC 6.3 6.2 5.4  HGB 8.1* 8.1* 9.1*  HCT 28.7* 28.8*  31.7*  MCV 78.8* 80.2 80.9  PLT 384 453* 486*    Cardiac Enzymes: No results for input(s): CKTOTAL, CKMB, CKMBINDEX, TROPONINI in the last 168 hours.  BNP (last 3 results) No results for input(s): BNP in the last 8760 hours.  ProBNP (last 3 results) No results for input(s): PROBNP in the last 8760 hours.  Radiological Exams: No results found.  Assessment/Plan Active Problems:   Acute on chronic respiratory failure with hypoxia (HCC)   Haemophilus influenzae pneumonia (HCC)   Chronic atrial fibrillation   Other secondary pulmonary hypertension (HCC)   Chronic diastolic heart failure (HCC)   1. Acute on chronic respiratory with hypoxia continue to titrate oxygen as ordered.  Continue supportive measures and pulmonary toilet 2. Haemophilus influenza pneumonia treated continue to monitor 3. Chronic atrial fibrillation rate controlled 4. Secondary pulmonary hypertension continue oxygen therapy as needed 5. Chronic diastolic failure continue diuretics as necessary   I have personally seen and evaluated the patient, evaluated laboratory and imaging results, formulated the assessment and plan and placed orders. The Patient requires high complexity decision making for assessment and support.  Case was discussed on Rounds with the Respiratory Therapy Staff  Yevonne Pax, MD [facility] Pulmonary Critical Care Medicine Sleep Medicine

## 2018-10-30 DIAGNOSIS — J14 Pneumonia due to Hemophilus influenzae: Secondary | ICD-10-CM | POA: Diagnosis not present

## 2018-10-30 DIAGNOSIS — J9621 Acute and chronic respiratory failure with hypoxia: Secondary | ICD-10-CM | POA: Diagnosis not present

## 2018-10-30 DIAGNOSIS — I5032 Chronic diastolic (congestive) heart failure: Secondary | ICD-10-CM | POA: Diagnosis not present

## 2018-10-30 DIAGNOSIS — I482 Chronic atrial fibrillation, unspecified: Secondary | ICD-10-CM | POA: Diagnosis not present

## 2018-10-30 NOTE — Progress Notes (Addendum)
Pulmonary Critical Care Medicine [facility] [place]   PULMONARY CRITICAL CARE SERVICE  PROGRESS NOTE  Date of Service: 10/30/2018  Stacey Calderon  [id number]  DOB: 1960-07-19   DOA: 10/21/2018  Referring Physician: Carron Curie, MD  HPI: Stacey Calderon is a 58 y.o. female seen for follow up of Acute on Chronic Respiratory Failure.  Patient continues on 28% aerosol trach collar using PMV without difficulty.  We will start with capping trials at this time.  Medications: Reviewed on Rounds  Physical Exam:  Vitals: Pulse 70 respirations 20 BP 152/85 O2 sat 1% temp 97.3  Ventilator Settings 28% ATC  . General: Comfortable at this time . Eyes: Grossly normal lids, irises & conjunctiva . ENT: grossly tongue is normal . Neck: no obvious mass . Cardiovascular: S1 S2 normal no gallop . Respiratory: No rales or rhonchi noted . Abdomen: soft . Skin: no rash seen on limited exam . Musculoskeletal: not rigid . Psychiatric:unable to assess . Neurologic: no seizure no involuntary movements         Lab Data:   Basic Metabolic Panel: Recent Labs  Lab 10/24/18 0431 10/25/18 0503 10/28/18 0710  NA  --  144 140  K 5.1 4.2 4.7  CL  --  96* 94*  CO2  --  40* 41*  GLUCOSE  --  83 128*  BUN  --  28* 19  CREATININE  --  0.85 0.77  CALCIUM  --  9.1 8.9  MG  --  2.2 1.8  PHOS  --  2.4* 2.5    ABG: No results for input(s): PHART, PCO2ART, PO2ART, HCO3, O2SAT in the last 168 hours.  Liver Function Tests: Recent Labs  Lab 10/25/18 0503  ALBUMIN 2.2*   No results for input(s): LIPASE, AMYLASE in the last 168 hours. No results for input(s): AMMONIA in the last 168 hours.  CBC: Recent Labs  Lab 10/25/18 0503 10/28/18 0710  WBC 6.2 5.4  HGB 8.1* 9.1*  HCT 28.8* 31.7*  MCV 80.2 80.9  PLT 453* 486*    Cardiac Enzymes: No results for input(s): CKTOTAL, CKMB, CKMBINDEX, TROPONINI in the last 168 hours.  BNP (last 3 results) No results for input(s): BNP in  the last 8760 hours.  ProBNP (last 3 results) No results for input(s): PROBNP in the last 8760 hours.  Radiological Exams: No results found.  Assessment/Plan Active Problems:   Acute on chronic respiratory failure with hypoxia (HCC)   Haemophilus influenzae pneumonia (HCC)   Chronic atrial fibrillation   Other secondary pulmonary hypertension (HCC)   Chronic diastolic heart failure (HCC)   1. Acute on chronic respiratory failure with hypoxia continue to titrate oxygen as ordered.  Begin capping trials and continue supportive measures 2. Haemophilus influenza pneumonia treated continue to monitor 3. Chronic fibrillation rate controlled 4. Secondary pulmonary hypertension continue oxygen therapy as needed 5. Chronic diastolic failure continue diuretics   I have personally seen and evaluated the patient, evaluated laboratory and imaging results, formulated the assessment and plan and placed orders. The Patient requires high complexity decision making for assessment and support.  Case was discussed on Rounds with the Respiratory Therapy Staff  Yevonne Pax, MD [facility] Pulmonary Critical Care Medicine Sleep Medicine

## 2018-10-31 DIAGNOSIS — J14 Pneumonia due to Hemophilus influenzae: Secondary | ICD-10-CM | POA: Diagnosis not present

## 2018-10-31 DIAGNOSIS — J9621 Acute and chronic respiratory failure with hypoxia: Secondary | ICD-10-CM | POA: Diagnosis not present

## 2018-10-31 DIAGNOSIS — I482 Chronic atrial fibrillation, unspecified: Secondary | ICD-10-CM | POA: Diagnosis not present

## 2018-10-31 DIAGNOSIS — I5032 Chronic diastolic (congestive) heart failure: Secondary | ICD-10-CM | POA: Diagnosis not present

## 2018-10-31 NOTE — Progress Notes (Addendum)
Pulmonary Critical Care Medicine [facility] [place]   PULMONARY CRITICAL CARE SERVICE  PROGRESS NOTE  Date of Service: 10/31/2018  Stacey Calderon  [id number]  DOB: [birth year]   DOA: 10/21/2018  Referring Physician: Carron Curie, MD  HPI: Stacey Calderon is a 58 y.o. female seen for follow up of Acute on Chronic Respiratory Failure.  Patient remains on 28% aerosol trach collar.  Patient using PMV without difficulty.  We continue to await patient's family to bring CPAP in.    Medications: Reviewed on Rounds  Physical Exam:  Vitals: Pulse 71 respirations 18 BP 166/87 O2 sat 100% temp 98.7  Ventilator Settings 28% ATC  . General: Comfortable at this time . Eyes: Grossly normal lids, irises & conjunctiva . ENT: grossly tongue is normal . Neck: no obvious mass . Cardiovascular: S1 S2 normal no gallop . Respiratory: No rales or rhonchi noted . Abdomen: soft . Skin: no rash seen on limited exam . Musculoskeletal: not rigid . Psychiatric:unable to assess . Neurologic: no seizure no involuntary movements         Lab Data:   Basic Metabolic Panel: Recent Labs  Lab 10/25/18 0503 10/28/18 0710  NA 144 140  K 4.2 4.7  CL 96* 94*  CO2 40* 41*  GLUCOSE 83 128*  BUN 28* 19  CREATININE 0.85 0.77  CALCIUM 9.1 8.9  MG 2.2 1.8  PHOS 2.4* 2.5    ABG: No results for input(s): PHART, PCO2ART, PO2ART, HCO3, O2SAT in the last 168 hours.  Liver Function Tests: Recent Labs  Lab 10/25/18 0503  ALBUMIN 2.2*   No results for input(s): LIPASE, AMYLASE in the last 168 hours. No results for input(s): AMMONIA in the last 168 hours.  CBC: Recent Labs  Lab 10/25/18 0503 10/28/18 0710  WBC 6.2 5.4  HGB 8.1* 9.1*  HCT 28.8* 31.7*  MCV 80.2 80.9  PLT 453* 486*    Cardiac Enzymes: No results for input(s): CKTOTAL, CKMB, CKMBINDEX, TROPONINI in the last 168 hours.  BNP (last 3 results) No results for input(s): BNP in the last 8760 hours.  ProBNP (last 3  results) No results for input(s): PROBNP in the last 8760 hours.  Radiological Exams: No results found.  Assessment/Plan Active Problems:   Acute on chronic respiratory failure with hypoxia (HCC)   Haemophilus influenzae pneumonia (HCC)   Chronic atrial fibrillation   Other secondary pulmonary hypertension (HCC)   Chronic diastolic heart failure (HCC)   1. Acute on chronic respiratory failure with hypoxia continue to titrate oxygen as ordered.  Continue to attempt capping and continue supportive measures 2. Haemophilus influenza pneumonia treated continue to monitor 3. Chronic atrial fibrillation rate controlled 4. Secondary pulmonary hypertension continue oxygen therapy as needed 5. Chronic diastolic failure continue diuretics   I have personally seen and evaluated the patient, evaluated laboratory and imaging results, formulated the assessment and plan and placed orders. The Patient requires high complexity decision making for assessment and support.  Case was discussed on Rounds with the Respiratory Therapy Staff  Yevonne Pax, MD [facility] Pulmonary Critical Care Medicine Sleep Medicine

## 2018-11-01 DIAGNOSIS — I482 Chronic atrial fibrillation, unspecified: Secondary | ICD-10-CM | POA: Diagnosis not present

## 2018-11-01 DIAGNOSIS — J9621 Acute and chronic respiratory failure with hypoxia: Secondary | ICD-10-CM | POA: Diagnosis not present

## 2018-11-01 DIAGNOSIS — I5032 Chronic diastolic (congestive) heart failure: Secondary | ICD-10-CM | POA: Diagnosis not present

## 2018-11-01 DIAGNOSIS — J14 Pneumonia due to Hemophilus influenzae: Secondary | ICD-10-CM | POA: Diagnosis not present

## 2018-11-01 NOTE — Progress Notes (Addendum)
Pulmonary Critical Care Medicine [facility] [place]   PULMONARY CRITICAL CARE SERVICE  PROGRESS NOTE  Date of Service: 11/01/2018  Stacey Calderon  [id number]  DOB: Jul 03, 1960   DOA: 10/21/2018  Referring Physician: Carron Curie, MD  HPI: Stacey Calderon is a 58 y.o. female seen for follow up of Acute on Chronic Respiratory Failure.  Patient remains on aerosol trach collar 28% FiO2 using PMV with no difficulty.  Medications: Reviewed on Rounds  Physical Exam:  Vitals: Pulse 76 respirations 18 BP 171/73 O2 sat 98% temp 98.1  Ventilator Settings 28% ATC  . General: Comfortable at this time . Eyes: Grossly normal lids, irises & conjunctiva . ENT: grossly tongue is normal . Neck: no obvious mass . Cardiovascular: S1 S2 normal no gallop . Respiratory: No rales or rhonchi noted . Abdomen: soft . Skin: no rash seen on limited exam . Musculoskeletal: not rigid . Psychiatric:unable to assess . Neurologic: no seizure no involuntary movements         Lab Data:   Basic Metabolic Panel: Recent Labs  Lab 10/28/18 0710  NA 140  K 4.7  CL 94*  CO2 41*  GLUCOSE 128*  BUN 19  CREATININE 0.77  CALCIUM 8.9  MG 1.8  PHOS 2.5    ABG: No results for input(s): PHART, PCO2ART, PO2ART, HCO3, O2SAT in the last 168 hours.  Liver Function Tests: No results for input(s): AST, ALT, ALKPHOS, BILITOT, PROT, ALBUMIN in the last 168 hours. No results for input(s): LIPASE, AMYLASE in the last 168 hours. No results for input(s): AMMONIA in the last 168 hours.  CBC: Recent Labs  Lab 10/28/18 0710  WBC 5.4  HGB 9.1*  HCT 31.7*  MCV 80.9  PLT 486*    Cardiac Enzymes: No results for input(s): CKTOTAL, CKMB, CKMBINDEX, TROPONINI in the last 168 hours.  BNP (last 3 results) No results for input(s): BNP in the last 8760 hours.  ProBNP (last 3 results) No results for input(s): PROBNP in the last 8760 hours.  Radiological Exams: No results  found.  Assessment/Plan Active Problems:   Acute on chronic respiratory failure with hypoxia (HCC)   Haemophilus influenzae pneumonia (HCC)   Chronic atrial fibrillation   Other secondary pulmonary hypertension (HCC)   Chronic diastolic heart failure (HCC)   1. Acute on chronic respiratory failure with hypoxia continue to titrate oxygen as ordered.  Continue supportive measures and pulmonary toilet. 2. Haemophilus influenza pneumonia treated continue to monitor 3. Chronic atrial fibrillation rate controlled 4. Secondary pulmonary hypertension continue oxygen therapy as needed 5. Chronic diastolic heart failure continue diuretics   I have personally seen and evaluated the patient, evaluated laboratory and imaging results, formulated the assessment and plan and placed orders. The Patient requires high complexity decision making for assessment and support.  Case was discussed on Rounds with the Respiratory Therapy Staff  Yevonne Pax, MD [facility] Pulmonary Critical Care Medicine Sleep Medicine

## 2018-11-02 DIAGNOSIS — I482 Chronic atrial fibrillation, unspecified: Secondary | ICD-10-CM | POA: Diagnosis not present

## 2018-11-02 DIAGNOSIS — J14 Pneumonia due to Hemophilus influenzae: Secondary | ICD-10-CM | POA: Diagnosis not present

## 2018-11-02 DIAGNOSIS — J9621 Acute and chronic respiratory failure with hypoxia: Secondary | ICD-10-CM | POA: Diagnosis not present

## 2018-11-02 DIAGNOSIS — I5032 Chronic diastolic (congestive) heart failure: Secondary | ICD-10-CM | POA: Diagnosis not present

## 2018-11-02 LAB — BASIC METABOLIC PANEL
Anion gap: 11 (ref 5–15)
BUN: 8 mg/dL (ref 6–20)
CO2: 38 mmol/L — ABNORMAL HIGH (ref 22–32)
Calcium: 8.9 mg/dL (ref 8.9–10.3)
Chloride: 91 mmol/L — ABNORMAL LOW (ref 98–111)
Creatinine, Ser: 0.7 mg/dL (ref 0.44–1.00)
GFR calc Af Amer: >60 mL/min (ref >60)
GFR calc non Af Amer: >60 mL/min (ref >60)
Glucose, Bld: 82 mg/dL (ref 70–99)
Potassium: 3.9 mmol/L (ref 3.5–5.1)
Sodium: 140 mmol/L (ref 135–145)

## 2018-11-02 LAB — MAGNESIUM: Magnesium: 1.5 mg/dL — ABNORMAL LOW (ref 1.7–2.4)

## 2018-11-02 LAB — CBC
HCT: 32.6 % — ABNORMAL LOW (ref 36.0–46.0)
Hemoglobin: 9.4 g/dL — ABNORMAL LOW (ref 12.0–15.0)
MCH: 23.3 pg — ABNORMAL LOW (ref 26.0–34.0)
MCHC: 28.8 g/dL — ABNORMAL LOW (ref 30.0–36.0)
MCV: 80.7 fL (ref 80.0–100.0)
Platelets: 427 10*3/uL — ABNORMAL HIGH (ref 150–400)
RBC: 4.04 MIL/uL (ref 3.87–5.11)
RDW: 22.3 % — ABNORMAL HIGH (ref 11.5–15.5)
WBC: 5.1 10*3/uL (ref 4.0–10.5)
nRBC: 0 % (ref 0.0–0.2)

## 2018-11-02 NOTE — Progress Notes (Addendum)
Pulmonary Critical Care Medicine [facility] [place]   PULMONARY CRITICAL CARE SERVICE  PROGRESS NOTE  Date of Service: 11/02/2018  Stacey Calderon  [id number]  DOB: 06-13-1961   DOA: 10/21/2018  Referring Physician: Carron Curie, MD  HPI: Stacey Calderon is a 58 y.o. female seen for follow up of Acute on Chronic Respiratory Failure.  Patient remains on aerosol trach collar 28% FiO2.  Patient is refusing capping at this time.  Overall doing well.  Medications: Reviewed on Rounds  Physical Exam:  Vitals: Pulse 80 respirations 14 BP 176/89 O2 sat 100% temp 98.6  Ventilator Settings ATC 28%  . General: Comfortable at this time . Eyes: Grossly normal lids, irises & conjunctiva . ENT: grossly tongue is normal . Neck: no obvious mass . Cardiovascular: S1 S2 normal no gallop . Respiratory: No rales or rhonchi noted . Abdomen: soft . Skin: no rash seen on limited exam . Musculoskeletal: not rigid . Psychiatric:unable to assess . Neurologic: no seizure no involuntary movements         Lab Data:   Basic Metabolic Panel: Recent Labs  Lab 10/28/18 0710 11/02/18 0424  NA 140 140  K 4.7 3.9  CL 94* 91*  CO2 41* 38*  GLUCOSE 128* 82  BUN 19 8  CREATININE 0.77 0.70  CALCIUM 8.9 8.9  MG 1.8 1.5*  PHOS 2.5  --     ABG: No results for input(s): PHART, PCO2ART, PO2ART, HCO3, O2SAT in the last 168 hours.  Liver Function Tests: No results for input(s): AST, ALT, ALKPHOS, BILITOT, PROT, ALBUMIN in the last 168 hours. No results for input(s): LIPASE, AMYLASE in the last 168 hours. No results for input(s): AMMONIA in the last 168 hours.  CBC: Recent Labs  Lab 10/28/18 0710 11/02/18 0424  WBC 5.4 5.1  HGB 9.1* 9.4*  HCT 31.7* 32.6*  MCV 80.9 80.7  PLT 486* 427*    Cardiac Enzymes: No results for input(s): CKTOTAL, CKMB, CKMBINDEX, TROPONINI in the last 168 hours.  BNP (last 3 results) No results for input(s): BNP in the last 8760 hours.  ProBNP  (last 3 results) No results for input(s): PROBNP in the last 8760 hours.  Radiological Exams: No results found.  Assessment/Plan Active Problems:   Acute on chronic respiratory failure with hypoxia (HCC)   Haemophilus influenzae pneumonia (HCC)   Chronic atrial fibrillation   Other secondary pulmonary hypertension (HCC)   Chronic diastolic heart failure (HCC)   1. Acute on chronic respiratory failure with hypoxia continue titrate oxygen as ordered.  Continue pulmonary toilet supportive measures 2. Haemophilus influenza pneumonia treated continue to monitor 3. Chronic atrial fibrillation rate controlled 4. Secondary pulmonary hypertension continue oxygen therapy as needed 5. Chronic diastolic heart failure continue diuretics   I have personally seen and evaluated the patient, evaluated laboratory and imaging results, formulated the assessment and plan and placed orders. The Patient requires high complexity decision making for assessment and support.  Case was discussed on Rounds with the Respiratory Therapy Staff  Yevonne Pax, MD [facility] Pulmonary Critical Care Medicine Sleep Medicine

## 2018-11-03 DIAGNOSIS — J9621 Acute and chronic respiratory failure with hypoxia: Secondary | ICD-10-CM | POA: Diagnosis not present

## 2018-11-03 DIAGNOSIS — I5032 Chronic diastolic (congestive) heart failure: Secondary | ICD-10-CM | POA: Diagnosis not present

## 2018-11-03 DIAGNOSIS — J14 Pneumonia due to Hemophilus influenzae: Secondary | ICD-10-CM | POA: Diagnosis not present

## 2018-11-03 DIAGNOSIS — I482 Chronic atrial fibrillation, unspecified: Secondary | ICD-10-CM | POA: Diagnosis not present

## 2018-11-03 LAB — MAGNESIUM: Magnesium: 1.8 mg/dL (ref 1.7–2.4)

## 2018-11-03 LAB — URIC ACID: Uric Acid, Serum: 4.8 mg/dL (ref 2.5–7.1)

## 2018-11-03 NOTE — Progress Notes (Addendum)
Pulmonary Critical Care Medicine [facility] [place]   PULMONARY CRITICAL CARE SERVICE  PROGRESS NOTE  Date of Service: 11/04/2018  Stacey Calderon  [id number]  DOB: 06-Jun-1961   DOA: 10/21/2018  Referring Physician: Carron Curie, MD  HPI: Stacey Calderon is a 58 y.o. female seen for follow up of Acute on Chronic Respiratory Failure.  Patient remains on 28% aerosol trach collar at this time.  Using PMV with no difficulty.  Medications: Reviewed on Rounds  Physical Exam:  Vitals: Pulse 75 respirations 13 BP 127/69 O2 sat 96% temp 99.5  Ventilator Settings 28% ATC  . General: Comfortable at this time . Eyes: Grossly normal lids, irises & conjunctiva . ENT: grossly tongue is normal . Neck: no obvious mass . Cardiovascular: S1 S2 normal no gallop . Respiratory: No rales or rhonchi noted . Abdomen: soft . Skin: no rash seen on limited exam . Musculoskeletal: not rigid . Psychiatric:unable to assess . Neurologic: no seizure no involuntary movements         Lab Data:   Basic Metabolic Panel: Recent Labs  Lab 11/02/18 0424 11/03/18 0629  NA 140  --   K 3.9  --   CL 91*  --   CO2 38*  --   GLUCOSE 82  --   BUN 8  --   CREATININE 0.70  --   CALCIUM 8.9  --   MG 1.5* 1.8    ABG: No results for input(s): PHART, PCO2ART, PO2ART, HCO3, O2SAT in the last 168 hours.  Liver Function Tests: No results for input(s): AST, ALT, ALKPHOS, BILITOT, PROT, ALBUMIN in the last 168 hours. No results for input(s): LIPASE, AMYLASE in the last 168 hours. No results for input(s): AMMONIA in the last 168 hours.  CBC: Recent Labs  Lab 11/02/18 0424  WBC 5.1  HGB 9.4*  HCT 32.6*  MCV 80.7  PLT 427*    Cardiac Enzymes: No results for input(s): CKTOTAL, CKMB, CKMBINDEX, TROPONINI in the last 168 hours.  BNP (last 3 results) No results for input(s): BNP in the last 8760 hours.  ProBNP (last 3 results) No results for input(s): PROBNP in the last 8760  hours.  Radiological Exams: Dg Chest Port 1 View  Result Date: 11/04/2018 CLINICAL DATA:  58 year old female with a history of COPD EXAM: PORTABLE CHEST 1 VIEW COMPARISON:  10/21/2018 FINDINGS: Cardiomediastinal silhouette unchanged in size and contour. Unchanged position of tracheostomy. Low lung volumes persist with thickening of the minor fissure and mild interlobular septal thickening. No pneumothorax or large pleural effusion. Interval removal of gastric tube. Interval removal of right upper extremity PICC IMPRESSION: Low lung volumes with crowded interstitium, potentially atelectasis or mild pulmonary edema. Unchanged tracheostomy, with interval removal of the gastric tube and PICC. Electronically Signed   By: Gilmer Mor D.O.   On: 11/04/2018 14:42    Assessment/Plan Active Problems:   Acute on chronic respiratory failure with hypoxia (HCC)   Haemophilus influenzae pneumonia (HCC)   Chronic atrial fibrillation   Other secondary pulmonary hypertension (HCC)   Chronic diastolic heart failure (HCC)   1. Acute on chronic respiratory failure with hypoxia continue to titrate oxygen as tolerated.  Continue supportive measures and pulmonary toilet 2. Haemophilus influenza pneumonia treated continue to monitor 3. Chronic atrial fibrillation rate controlled 4. Secondary pulmonary hypertension continue oxygen therapy as needed 5. Chronic diastolic heart failure continue diuretics   I have personally seen and evaluated the patient, evaluated laboratory and imaging results, formulated the assessment  and plan and placed orders. The Patient requires high complexity decision making for assessment and support.  Case was discussed on Rounds with the Respiratory Therapy Staff  Allyne Gee, MD [facility] Pulmonary Critical Care Medicine Sleep Medicine

## 2018-11-04 ENCOUNTER — Other Ambulatory Visit (HOSPITAL_COMMUNITY): Payer: Self-pay

## 2018-11-04 DIAGNOSIS — I482 Chronic atrial fibrillation, unspecified: Secondary | ICD-10-CM | POA: Diagnosis not present

## 2018-11-04 DIAGNOSIS — J9621 Acute and chronic respiratory failure with hypoxia: Secondary | ICD-10-CM | POA: Diagnosis not present

## 2018-11-04 DIAGNOSIS — I5032 Chronic diastolic (congestive) heart failure: Secondary | ICD-10-CM | POA: Diagnosis not present

## 2018-11-04 DIAGNOSIS — J14 Pneumonia due to Hemophilus influenzae: Secondary | ICD-10-CM | POA: Diagnosis not present

## 2018-11-04 MED ORDER — Medication
Status: DC
Start: ? — End: 2018-11-04

## 2018-11-04 MED ORDER — GENERIC EXTERNAL MEDICATION
Status: DC
Start: ? — End: 2018-11-04

## 2018-11-04 NOTE — Progress Notes (Addendum)
[facility] [place]   PULMONARY CRITICAL CARE SERVICE  PROGRESS NOTE  Date of Service: 11/04/2018  Stacey Calderon  [id number]:[id number]  DOB: 09/12/1960   DOA: 10/21/2018  Referring Physician: Carron CurieAli Hijazi, MD  HPI: Stacey Calderon is a 58 y.o. female seen for follow up of Acute on Chronic Respiratory Failure.  Patient can still not tolerate capping.  Respiratory therapy reports they tried switching patient's trach with multiple different sizes with no success today.  Patient can still not tolerate capping.  He continues on 28% ATC with the PMV in place.  Medications: Reviewed on Rounds  Physical Exam:  Vitals: Pulse 90 respirations 30 BP 150/72 O2 sat 98% temp 98.2  Ventilator Settings 28% ATC  . General: Comfortable at this time . Eyes: Grossly normal lids, irises & conjunctiva . ENT: grossly tongue is normal . Neck: no obvious mass . Cardiovascular: S1 S2 normal no gallop . Respiratory: Rhonchi noted . Abdomen: soft . Skin: no rash seen on limited exam . Musculoskeletal: not rigid . Psychiatric:unable to assess . Neurologic: no seizure no involuntary movements         Lab Data:   Basic Metabolic Panel: Recent Labs  Lab 11/02/18 0424 11/03/18 0629  NA 140  --   K 3.9  --   CL 91*  --   CO2 38*  --   GLUCOSE 82  --   BUN 8  --   CREATININE 0.70  --   CALCIUM 8.9  --   MG 1.5* 1.8    ABG: No results for input(s): PHART, PCO2ART, PO2ART, HCO3, O2SAT in the last 168 hours.  Liver Function Tests: No results for input(s): AST, ALT, ALKPHOS, BILITOT, PROT, ALBUMIN in the last 168 hours. No results for input(s): LIPASE, AMYLASE in the last 168 hours. No results for input(s): AMMONIA in the last 168 hours.  CBC: Recent Labs  Lab 11/02/18 0424  WBC 5.1  HGB 9.4*  HCT 32.6*  MCV 80.7  PLT 427*    Cardiac Enzymes: No results for input(s): CKTOTAL, CKMB, CKMBINDEX, TROPONINI in the last 168 hours.  BNP (last 3  results) No results for input(s): BNP in the last 8760 hours.  ProBNP (last 3 results) No results for input(s): PROBNP in the last 8760 hours.  Radiological Exams: Dg Chest Port 1 View  Result Date: 11/04/2018 CLINICAL DATA:  58 year old female with a history of COPD EXAM: PORTABLE CHEST 1 VIEW COMPARISON:  10/21/2018 FINDINGS: Cardiomediastinal silhouette unchanged in size and contour. Unchanged position of tracheostomy. Low lung volumes persist with thickening of the minor fissure and mild interlobular septal thickening. No pneumothorax or large pleural effusion. Interval removal of gastric tube. Interval removal of right upper extremity PICC IMPRESSION: Low lung volumes with crowded interstitium, potentially atelectasis or mild pulmonary edema. Unchanged tracheostomy, with interval removal of the gastric tube and PICC. Electronically Signed   By: Gilmer MorJaime  Wagner D.O.   On: 11/04/2018 14:42    Assessment/Plan Active Problems:   Acute on chronic respiratory failure with hypoxia (HCC)   Haemophilus influenzae pneumonia (HCC)   Chronic atrial fibrillation   Other secondary pulmonary hypertension (HCC)   Chronic diastolic heart failure (HCC)   1. Acute on chronic respiratory failure with hypoxia continue titrate oxygen as tolerated continue supportive measures and pulmonary toilet 2. Haemophilus influenza pneumonia treated continue to monitor 3. Chronic atrial fibrillation rate controlled 4. Secondary pulmonary hypertension continue oxygen therapy as needed 5. Chronic diastolic heart failure  continue diuretics   I have personally seen and evaluated the patient, evaluated laboratory and imaging results, formulated the assessment and plan and placed orders. The Patient requires high complexity decision making for assessment and support.  Case was discussed on Rounds with the Respiratory Therapy Staff  Yevonne Pax, MD [facility] Pulmonary Critical Care Medicine Sleep Medicine

## 2018-11-05 DIAGNOSIS — J9621 Acute and chronic respiratory failure with hypoxia: Secondary | ICD-10-CM | POA: Diagnosis not present

## 2018-11-05 DIAGNOSIS — I482 Chronic atrial fibrillation, unspecified: Secondary | ICD-10-CM | POA: Diagnosis not present

## 2018-11-05 DIAGNOSIS — J14 Pneumonia due to Hemophilus influenzae: Secondary | ICD-10-CM | POA: Diagnosis not present

## 2018-11-05 DIAGNOSIS — I5032 Chronic diastolic (congestive) heart failure: Secondary | ICD-10-CM | POA: Diagnosis not present

## 2018-11-05 LAB — BASIC METABOLIC PANEL
Anion gap: 7 (ref 5–15)
BUN: 14 mg/dL (ref 6–20)
CO2: 35 mmol/L — ABNORMAL HIGH (ref 22–32)
Calcium: 8.9 mg/dL (ref 8.9–10.3)
Chloride: 95 mmol/L — ABNORMAL LOW (ref 98–111)
Creatinine, Ser: 1.04 mg/dL — ABNORMAL HIGH (ref 0.44–1.00)
GFR calc Af Amer: >60 mL/min (ref >60)
GFR calc non Af Amer: >60 mL/min (ref >60)
Glucose, Bld: 210 mg/dL — ABNORMAL HIGH (ref 70–99)
Potassium: 4.1 mmol/L (ref 3.5–5.1)
Sodium: 137 mmol/L (ref 135–145)

## 2018-11-05 LAB — CBC
HCT: 27.6 % — ABNORMAL LOW (ref 36.0–46.0)
Hemoglobin: 8 g/dL — ABNORMAL LOW (ref 12.0–15.0)
MCH: 23 pg — ABNORMAL LOW (ref 26.0–34.0)
MCHC: 29 g/dL — ABNORMAL LOW (ref 30.0–36.0)
MCV: 79.3 fL — ABNORMAL LOW (ref 80.0–100.0)
Platelets: 365 10*3/uL (ref 150–400)
RBC: 3.48 MIL/uL — ABNORMAL LOW (ref 3.87–5.11)
RDW: 21.6 % — ABNORMAL HIGH (ref 11.5–15.5)
WBC: 8.6 10*3/uL (ref 4.0–10.5)
nRBC: 0.2 % (ref 0.0–0.2)

## 2018-11-05 LAB — MAGNESIUM: Magnesium: 1.7 mg/dL (ref 1.7–2.4)

## 2018-11-05 LAB — PHOSPHORUS: Phosphorus: 3.5 mg/dL (ref 2.5–4.6)

## 2018-11-05 NOTE — Progress Notes (Addendum)
[facility] [place]   PULMONARY CRITICAL CARE SERVICE  PROGRESS NOTE  Date of Service: 11/05/2018  Stacey Calderon  [id number]  DOB: 05/24/1961   DOA: 10/21/2018  Referring Physician: Carron CurieAli Hijazi, MD  HPI: Stacey Calderon is a 58 y.o. female seen for follow up of Acute on Chronic Respiratory Failure.  Patient remains on 20% aerosol trach collar with PMV.  No difficulty or distress noted at this time.  Medications: Reviewed on Rounds  Physical Exam:  Vitals: Pulse 79 respirations 14 BP 131/69 O2 sat 99% and 97.8  Ventilator Settings 28% ATC  . General: Comfortable at this time . Eyes: Grossly normal lids, irises & conjunctiva . ENT: grossly tongue is normal . Neck: no obvious mass . Cardiovascular: S1 S2 normal no gallop . Respiratory: No rales or rhonchi noted . Abdomen: soft . Skin: no rash seen on limited exam . Musculoskeletal: not rigid . Psychiatric:unable to assess . Neurologic: no seizure no involuntary movements         Lab Data:   Basic Metabolic Panel: Recent Labs  Lab 11/02/18 0424 11/03/18 0629 11/05/18 0608  NA 140  --  137  K 3.9  --  4.1  CL 91*  --  95*  CO2 38*  --  35*  GLUCOSE 82  --  210*  BUN 8  --  14  CREATININE 0.70  --  1.04*  CALCIUM 8.9  --  8.9  MG 1.5* 1.8 1.7  PHOS  --   --  3.5    ABG: No results for input(s): PHART, PCO2ART, PO2ART, HCO3, O2SAT in the last 168 hours.  Liver Function Tests: No results for input(s): AST, ALT, ALKPHOS, BILITOT, PROT, ALBUMIN in the last 168 hours. No results for input(s): LIPASE, AMYLASE in the last 168 hours. No results for input(s): AMMONIA in the last 168 hours.  CBC: Recent Labs  Lab 11/02/18 0424 11/05/18 0608  WBC 5.1 8.6  HGB 9.4* 8.0*  HCT 32.6* 27.6*  MCV 80.7 79.3*  PLT 427* 365    Cardiac Enzymes: No results for input(s): CKTOTAL, CKMB, CKMBINDEX, TROPONINI in the last 168 hours.  BNP (last 3 results) No results for  input(s): BNP in the last 8760 hours.  ProBNP (last 3 results) No results for input(s): PROBNP in the last 8760 hours.  Radiological Exams: Dg Chest Port 1 View  Result Date: 11/04/2018 CLINICAL DATA:  58 year old female with a history of COPD EXAM: PORTABLE CHEST 1 VIEW COMPARISON:  10/21/2018 FINDINGS: Cardiomediastinal silhouette unchanged in size and contour. Unchanged position of tracheostomy. Low lung volumes persist with thickening of the minor fissure and mild interlobular septal thickening. No pneumothorax or large pleural effusion. Interval removal of gastric tube. Interval removal of right upper extremity PICC IMPRESSION: Low lung volumes with crowded interstitium, potentially atelectasis or mild pulmonary edema. Unchanged tracheostomy, with interval removal of the gastric tube and PICC. Electronically Signed   By: Gilmer MorJaime  Wagner D.O.   On: 11/04/2018 14:42    Assessment/Plan Active Problems:   Acute on chronic respiratory failure with hypoxia (HCC)   Haemophilus influenzae pneumonia (HCC)   Chronic atrial fibrillation   Other secondary pulmonary hypertension (HCC)   Chronic diastolic heart failure (HCC)   1. Acute on chronic respiratory failure with hypoxia continue titrate oxygen as tolerated and continue pulmonary toilet and supportive measures at this time. 2. Haemophilus influenza pneumonia treated continue to monitor 3. Chronic atrial fibrillation rate controlled 4. Secondary pulmonary  hypertension continue oxygen therapy as needed 5. Chronic diastolic heart failure continue diuretics   I have personally seen and evaluated the patient, evaluated laboratory and imaging results, formulated the assessment and plan and placed orders. The Patient requires high complexity decision making for assessment and support.  Case was discussed on Rounds with the Respiratory Therapy Staff  Yevonne Pax, MD [facility] Pulmonary Critical Care Medicine Sleep Medicine

## 2018-11-06 DIAGNOSIS — J9621 Acute and chronic respiratory failure with hypoxia: Secondary | ICD-10-CM | POA: Diagnosis not present

## 2018-11-06 DIAGNOSIS — J14 Pneumonia due to Hemophilus influenzae: Secondary | ICD-10-CM | POA: Diagnosis not present

## 2018-11-06 DIAGNOSIS — I482 Chronic atrial fibrillation, unspecified: Secondary | ICD-10-CM | POA: Diagnosis not present

## 2018-11-06 DIAGNOSIS — I5032 Chronic diastolic (congestive) heart failure: Secondary | ICD-10-CM | POA: Diagnosis not present

## 2018-11-06 NOTE — Progress Notes (Addendum)
Pulmonary Critical Care Medicine [facility] [place]   PULMONARY CRITICAL CARE SERVICE  PROGRESS NOTE  Date of Service: 11/06/2018  Stacey Calderon  [id number]  DOB: 05/14/1961   DOA: 10/21/2018  Referring Physician: Carron Curie, MD  HPI: Stacey Calderon is a 58 y.o. female seen for follow up of Acute on Chronic Respiratory Failure.  Patient remains on 28% aerosol trach collar using PMV with no difficulty satting well at this time.  Medications: Reviewed on Rounds  Physical Exam:  Vitals: Pulse 86 respirations 18 BP 129/76 O2 sat 100% temp 96.4  Ventilator Settings aerosol trach collar 28%  . General: Comfortable at this time . Eyes: Grossly normal lids, irises & conjunctiva . ENT: grossly tongue is normal . Neck: no obvious mass . Cardiovascular: S1 S2 normal no gallop . Respiratory: No rales or rhonchi noted . Abdomen: soft . Skin: no rash seen on limited exam . Musculoskeletal: not rigid . Psychiatric:unable to assess . Neurologic: no seizure no involuntary movements         Lab Data:   Basic Metabolic Panel: Recent Labs  Lab 11/02/18 0424 11/03/18 0629 11/05/18 0608  NA 140  --  137  K 3.9  --  4.1  CL 91*  --  95*  CO2 38*  --  35*  GLUCOSE 82  --  210*  BUN 8  --  14  CREATININE 0.70  --  1.04*  CALCIUM 8.9  --  8.9  MG 1.5* 1.8 1.7  PHOS  --   --  3.5    ABG: No results for input(s): PHART, PCO2ART, PO2ART, HCO3, O2SAT in the last 168 hours.  Liver Function Tests: No results for input(s): AST, ALT, ALKPHOS, BILITOT, PROT, ALBUMIN in the last 168 hours. No results for input(s): LIPASE, AMYLASE in the last 168 hours. No results for input(s): AMMONIA in the last 168 hours.  CBC: Recent Labs  Lab 11/02/18 0424 11/05/18 0608  WBC 5.1 8.6  HGB 9.4* 8.0*  HCT 32.6* 27.6*  MCV 80.7 79.3*  PLT 427* 365    Cardiac Enzymes: No results for input(s): CKTOTAL, CKMB, CKMBINDEX, TROPONINI in the last 168 hours.  BNP (last 3  results) No results for input(s): BNP in the last 8760 hours.  ProBNP (last 3 results) No results for input(s): PROBNP in the last 8760 hours.  Radiological Exams: No results found.  Assessment/Plan Active Problems:   Acute on chronic respiratory failure with hypoxia (HCC)   Haemophilus influenzae pneumonia (HCC)   Chronic atrial fibrillation   Other secondary pulmonary hypertension (HCC)   Chronic diastolic heart failure (HCC)   1. Acute on chronic respiratory failure with hypoxia continue titrate oxygen as tolerated continue pulmonary toilet and supportive measures as well as secretion management 2. Haemophilus influenza pneumonia treated continue to monitor 3. Chronic atrial fibrillation rate controlled 4. Secondary pulmonary hypertension continue oxygen therapy as needed 5. Chronic diastolic heart failure continue diuretics   I have personally seen and evaluated the patient, evaluated laboratory and imaging results, formulated the assessment and plan and placed orders. The Patient requires high complexity decision making for assessment and support.  Case was discussed on Rounds with the Respiratory Therapy Staff  Yevonne Pax, MD [facility] Pulmonary Critical Care Medicine Sleep Medicine

## 2018-11-07 DIAGNOSIS — I5032 Chronic diastolic (congestive) heart failure: Secondary | ICD-10-CM | POA: Diagnosis not present

## 2018-11-07 DIAGNOSIS — J14 Pneumonia due to Hemophilus influenzae: Secondary | ICD-10-CM | POA: Diagnosis not present

## 2018-11-07 DIAGNOSIS — J9621 Acute and chronic respiratory failure with hypoxia: Secondary | ICD-10-CM | POA: Diagnosis not present

## 2018-11-07 DIAGNOSIS — I482 Chronic atrial fibrillation, unspecified: Secondary | ICD-10-CM | POA: Diagnosis not present

## 2018-11-07 LAB — PHOSPHORUS: Phosphorus: 4.1 mg/dL (ref 2.5–4.6)

## 2018-11-07 LAB — BASIC METABOLIC PANEL
Anion gap: 8 (ref 5–15)
BUN: 15 mg/dL (ref 6–20)
CO2: 36 mmol/L — ABNORMAL HIGH (ref 22–32)
Calcium: 9.1 mg/dL (ref 8.9–10.3)
Chloride: 96 mmol/L — ABNORMAL LOW (ref 98–111)
Creatinine, Ser: 0.91 mg/dL (ref 0.44–1.00)
GFR calc Af Amer: >60 mL/min (ref >60)
GFR calc non Af Amer: >60 mL/min (ref >60)
Glucose, Bld: 162 mg/dL — ABNORMAL HIGH (ref 70–99)
Potassium: 4.5 mmol/L (ref 3.5–5.1)
Sodium: 140 mmol/L (ref 135–145)

## 2018-11-07 LAB — MAGNESIUM: Magnesium: 2.1 mg/dL (ref 1.7–2.4)

## 2018-11-07 NOTE — Progress Notes (Signed)
Pulmonary Critical Care Medicine [facility] [place]   PULMONARY CRITICAL CARE SERVICE  PROGRESS NOTE  Date of Service: 11/07/2018  Stacey Calderon  [id number]  DOB: [birth year]   DOA: 10/21/2018  Referring Physician: Carron Curie, MD  HPI: Stacey Calderon is a 58 y.o. female seen for follow up of Acute on Chronic Respiratory Failure.  Patient is on T collar right now on 28% FiO2 with PMV in place has not been tolerating changes in the trach tube.  Patient did not tolerated #4 patient has to remain on the T collar as a result.  Medications: Reviewed on Rounds  Physical Exam:  Vitals: Temperature 97.5 pulse 76 respiratory 16 blood pressure 142/71 saturations 99%  Ventilator Settings off the ventilator right now on T collar  . General: Comfortable at this time . Eyes: Grossly normal lids, irises & conjunctiva . ENT: grossly tongue is normal . Neck: no obvious mass . Cardiovascular: S1 S2 normal no gallop . Respiratory: No rhonchi coarse breath sounds . Abdomen: soft . Skin: no rash seen on limited exam . Musculoskeletal: not rigid . Psychiatric:unable to assess . Neurologic: no seizure no involuntary movements         Lab Data:   Basic Metabolic Panel: Recent Labs  Lab 11/02/18 0424 11/03/18 0629 11/05/18 0608 11/07/18 0712  NA 140  --  137 140  K 3.9  --  4.1 4.5  CL 91*  --  95* 96*  CO2 38*  --  35* 36*  GLUCOSE 82  --  210* 162*  BUN 8  --  14 15  CREATININE 0.70  --  1.04* 0.91  CALCIUM 8.9  --  8.9 9.1  MG 1.5* 1.8 1.7 2.1  PHOS  --   --  3.5 4.1    ABG: No results for input(s): PHART, PCO2ART, PO2ART, HCO3, O2SAT in the last 168 hours.  Liver Function Tests: No results for input(s): AST, ALT, ALKPHOS, BILITOT, PROT, ALBUMIN in the last 168 hours. No results for input(s): LIPASE, AMYLASE in the last 168 hours. No results for input(s): AMMONIA in the last 168 hours.  CBC: Recent Labs  Lab 11/02/18 0424 11/05/18 0608  WBC 5.1 8.6   HGB 9.4* 8.0*  HCT 32.6* 27.6*  MCV 80.7 79.3*  PLT 427* 365    Cardiac Enzymes: No results for input(s): CKTOTAL, CKMB, CKMBINDEX, TROPONINI in the last 168 hours.  BNP (last 3 results) No results for input(s): BNP in the last 8760 hours.  ProBNP (last 3 results) No results for input(s): PROBNP in the last 8760 hours.  Radiological Exams: No results found.  Assessment/Plan Active Problems:   Acute on chronic respiratory failure with hypoxia (HCC)   Haemophilus influenzae pneumonia (HCC)   Chronic atrial fibrillation   Other secondary pulmonary hypertension (HCC)   Chronic diastolic heart failure (HCC)   1. Acute on chronic respiratory failure with hypoxia we will continue with the T collar for now also recommended getting a ENT evaluation of the upper airway to make sure there is no stenosis. 2. Chronic atrial fibrillation rate is controlled we will continue with present management 3. Secondary pulmonary hypertension at baseline 4. Chronic diastolic heart failure at baseline continue present management   I have personally seen and evaluated the patient, evaluated laboratory and imaging results, formulated the assessment and plan and placed orders. The Patient requires high complexity decision making for assessment and support.  Case was discussed on Rounds with the Respiratory Therapy Staff  Allyne Gee, MD [facility] Pulmonary Critical Care Medicine Sleep Medicine

## 2019-02-25 DEATH — deceased

## 2019-07-21 IMAGING — DX PORTABLE CHEST - 1 VIEW
1 series · 1 of 1 positions shown · non-contrast
Comparison: 10/21/2018

CLINICAL DATA: 57-year-old female with a history of COPD

EXAM:
PORTABLE CHEST 1 VIEW

[chest ap]
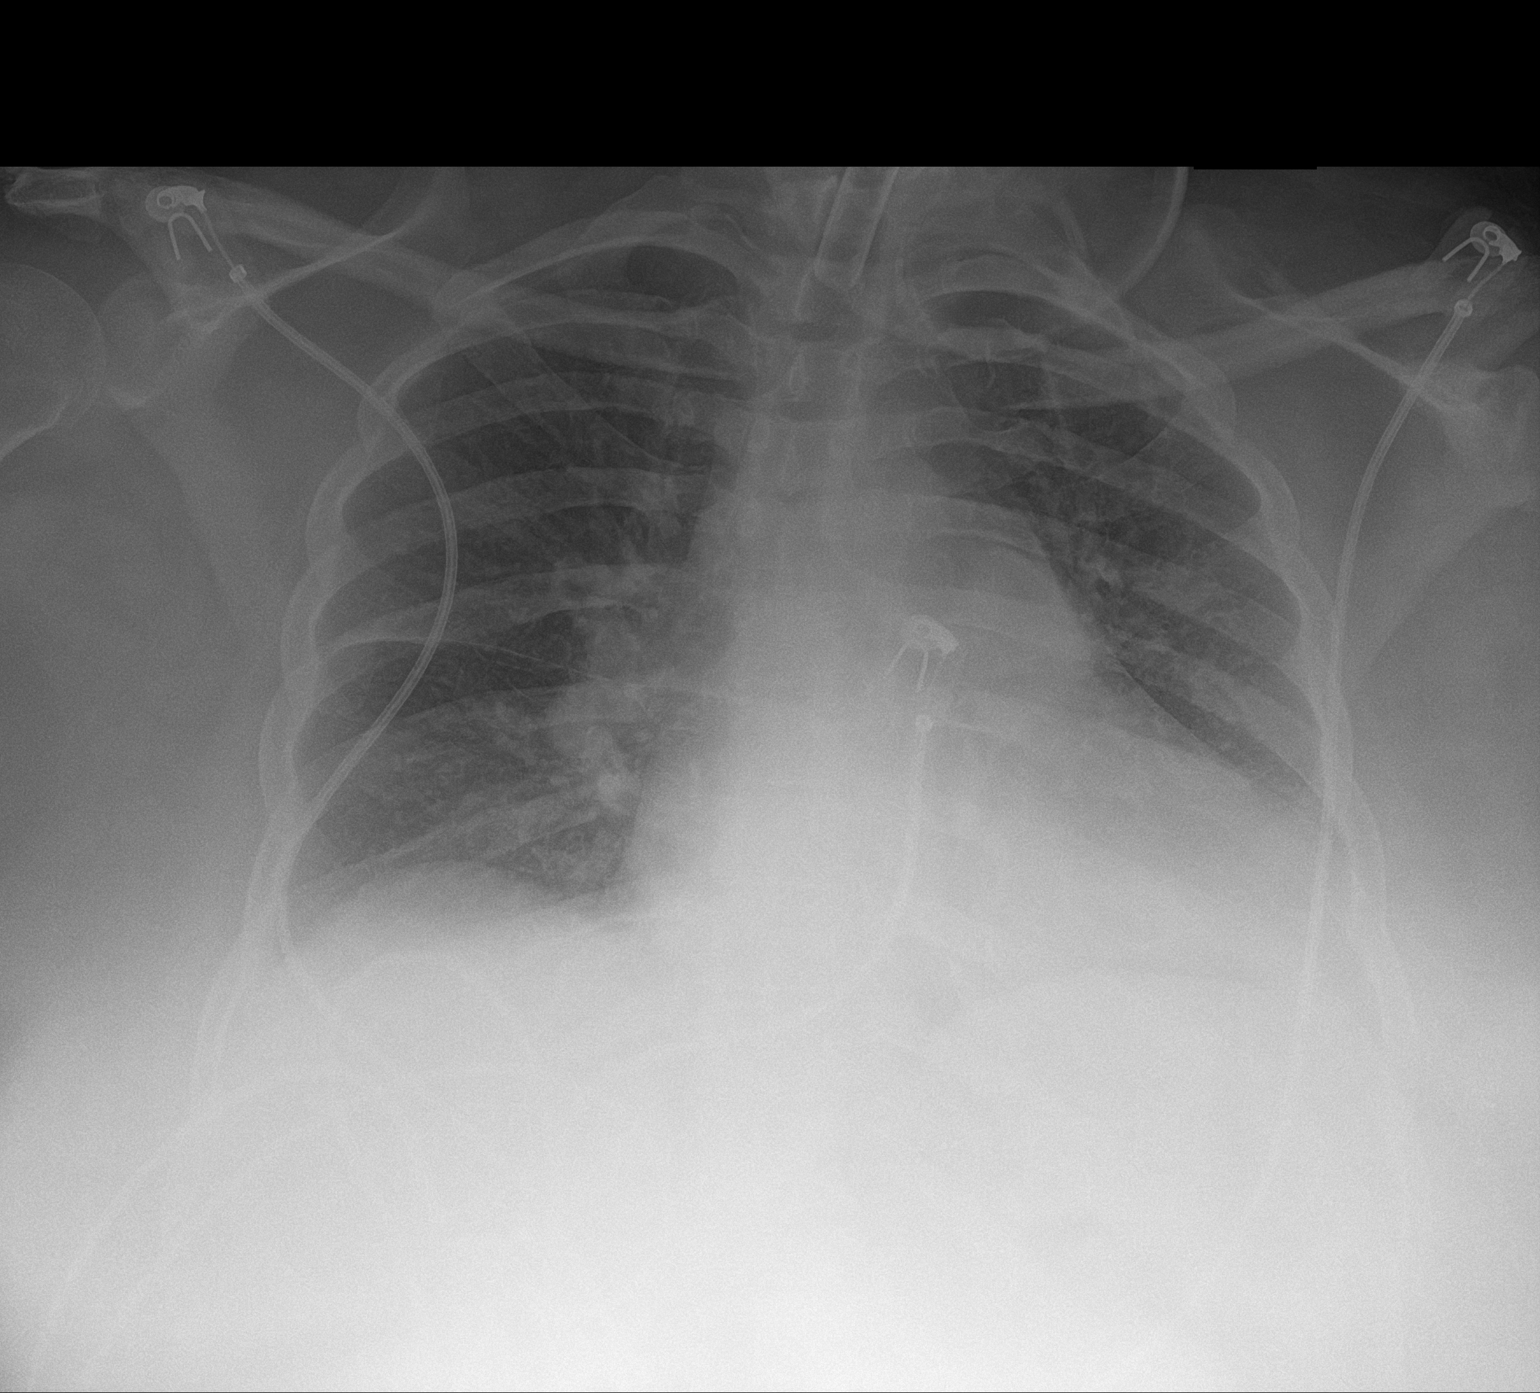

[1 of 1 positions shown; findings below may reference images not displayed]

FINDINGS: Cardiomediastinal silhouette unchanged in size and contour.

Unchanged position of tracheostomy.

Low lung volumes persist with thickening of the minor fissure and
mild interlobular septal thickening. No pneumothorax or large
pleural effusion.

Interval removal of gastric tube.

Interval removal of right upper extremity PICC
IMPRESSION: Low lung volumes with crowded interstitium, potentially atelectasis
or mild pulmonary edema.

Unchanged tracheostomy, with interval removal of the gastric tube
and PICC.
